# Patient Record
Sex: Female | Born: 1995 | Race: Black or African American | Hispanic: No | Marital: Single | Smoking: Never smoker
Health system: Southern US, Community
[De-identification: ages and names within clinical notes are randomized; demographics above are authoritative.]

## PROBLEM LIST (undated history)

## (undated) ENCOUNTER — Ambulatory Visit: Admission: EM | Payer: Medicaid Other | Source: Home / Self Care

## (undated) ENCOUNTER — Inpatient Hospital Stay (HOSPITAL_COMMUNITY): Payer: Self-pay

## (undated) DIAGNOSIS — M549 Dorsalgia, unspecified: Secondary | ICD-10-CM

## (undated) DIAGNOSIS — Z789 Other specified health status: Secondary | ICD-10-CM

## (undated) DIAGNOSIS — O9989 Other specified diseases and conditions complicating pregnancy, childbirth and the puerperium: Secondary | ICD-10-CM

## (undated) HISTORY — PX: MYRINGOTOMY WITH TUBE PLACEMENT: SHX5663

---
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
## 2015-03-29 ENCOUNTER — Emergency Department (HOSPITAL_COMMUNITY)
Admission: EM | Admit: 2015-03-29 | Discharge: 2015-03-29 | Disposition: A | Discharge status: Home / Self Care | Payer: Medicaid Other | Attending: Emergency Medicine | Admitting: Emergency Medicine

## 2015-03-29 ENCOUNTER — Emergency Department (HOSPITAL_COMMUNITY)
Admission: EM | Admit: 2015-03-29 | Discharge: 2015-03-29 | Disposition: A | Discharge status: Home / Self Care | Payer: Medicaid Other | Source: Home / Self Care | Attending: Emergency Medicine | Admitting: Emergency Medicine

## 2015-03-29 ENCOUNTER — Encounter (HOSPITAL_COMMUNITY): Payer: Self-pay | Admitting: Emergency Medicine

## 2015-03-29 ENCOUNTER — Encounter (HOSPITAL_COMMUNITY): Payer: Self-pay

## 2015-03-29 DIAGNOSIS — R1110 Vomiting, unspecified: Secondary | ICD-10-CM

## 2015-03-29 DIAGNOSIS — R1030 Lower abdominal pain, unspecified: Secondary | ICD-10-CM

## 2015-03-29 DIAGNOSIS — Z3202 Encounter for pregnancy test, result negative: Secondary | ICD-10-CM | POA: Diagnosis not present

## 2015-03-29 DIAGNOSIS — Z720 Tobacco use: Secondary | ICD-10-CM | POA: Insufficient documentation

## 2015-03-29 DIAGNOSIS — R42 Dizziness and giddiness: Secondary | ICD-10-CM | POA: Insufficient documentation

## 2015-03-29 DIAGNOSIS — N898 Other specified noninflammatory disorders of vagina: Secondary | ICD-10-CM | POA: Diagnosis not present

## 2015-03-29 DIAGNOSIS — R197 Diarrhea, unspecified: Secondary | ICD-10-CM | POA: Diagnosis not present

## 2015-03-29 DIAGNOSIS — R109 Unspecified abdominal pain: Secondary | ICD-10-CM

## 2015-03-29 DIAGNOSIS — R112 Nausea with vomiting, unspecified: Secondary | ICD-10-CM | POA: Diagnosis not present

## 2015-03-29 LAB — POC URINE PREG, ED: Preg Test, Ur: NEGATIVE

## 2015-03-29 LAB — WET PREP, GENITAL
CLUE CELLS WET PREP: NONE SEEN
TRICH WET PREP: NONE SEEN
WBC WET PREP: NONE SEEN
Yeast Wet Prep HPF POC: NONE SEEN

## 2015-03-29 LAB — COMPREHENSIVE METABOLIC PANEL
ALK PHOS: 67 U/L (ref 38–126)
ALT: 13 U/L — AB (ref 14–54)
AST: 19 U/L (ref 15–41)
Albumin: 4 g/dL (ref 3.5–5.0)
Anion gap: 10 (ref 5–15)
BUN: 12 mg/dL (ref 6–20)
CHLORIDE: 103 mmol/L (ref 101–111)
CO2: 25 mmol/L (ref 22–32)
Calcium: 9.8 mg/dL (ref 8.9–10.3)
Creatinine, Ser: 0.81 mg/dL (ref 0.44–1.00)
GLUCOSE: 101 mg/dL — AB (ref 65–99)
POTASSIUM: 3.7 mmol/L (ref 3.5–5.1)
Sodium: 138 mmol/L (ref 135–145)
Total Bilirubin: 0.6 mg/dL (ref 0.3–1.2)
Total Protein: 7.6 g/dL (ref 6.5–8.1)

## 2015-03-29 LAB — URINALYSIS, ROUTINE W REFLEX MICROSCOPIC
BILIRUBIN URINE: NEGATIVE
GLUCOSE, UA: NEGATIVE mg/dL
Hgb urine dipstick: NEGATIVE
KETONES UR: 15 mg/dL — AB
LEUKOCYTES UA: NEGATIVE
Nitrite: NEGATIVE
PH: 6.5 (ref 5.0–8.0)
PROTEIN: NEGATIVE mg/dL
Specific Gravity, Urine: 1.03 (ref 1.005–1.030)
Urobilinogen, UA: 1 mg/dL (ref 0.0–1.0)

## 2015-03-29 LAB — CBC WITH DIFFERENTIAL/PLATELET
BASOS ABS: 0.1 10*3/uL (ref 0.0–0.1)
BASOS PCT: 1 % (ref 0–1)
EOS ABS: 0.1 10*3/uL (ref 0.0–0.7)
Eosinophils Relative: 2 % (ref 0–5)
HCT: 35.6 % — ABNORMAL LOW (ref 36.0–46.0)
HEMOGLOBIN: 10.8 g/dL — AB (ref 12.0–15.0)
LYMPHS PCT: 34 % (ref 12–46)
Lymphs Abs: 2.2 10*3/uL (ref 0.7–4.0)
MCH: 22.7 pg — AB (ref 26.0–34.0)
MCHC: 30.3 g/dL (ref 30.0–36.0)
MCV: 74.9 fL — ABNORMAL LOW (ref 78.0–100.0)
MONO ABS: 0.5 10*3/uL (ref 0.1–1.0)
Monocytes Relative: 7 % (ref 3–12)
NEUTROS PCT: 56 % (ref 43–77)
Neutro Abs: 3.7 10*3/uL (ref 1.7–7.7)
PLATELETS: 330 10*3/uL (ref 150–400)
RBC: 4.75 MIL/uL (ref 3.87–5.11)
RDW: 16.3 % — AB (ref 11.5–15.5)
WBC: 6.6 10*3/uL (ref 4.0–10.5)

## 2015-03-29 LAB — HIV ANTIBODY (ROUTINE TESTING W REFLEX): HIV SCREEN 4TH GENERATION: NONREACTIVE

## 2015-03-29 LAB — GC/CHLAMYDIA PROBE AMP (~~LOC~~) NOT AT ARMC
Chlamydia: NEGATIVE
NEISSERIA GONORRHEA: NEGATIVE

## 2015-03-29 LAB — LIPASE, BLOOD: LIPASE: 24 U/L (ref 22–51)

## 2015-03-29 LAB — RPR: RPR: NONREACTIVE

## 2015-03-29 MED ORDER — ONDANSETRON HCL 4 MG PO TABS: 4.0000 mg | ORAL_TABLET | Freq: Three times a day (TID) | ORAL | Status: DC | PRN

## 2015-03-29 NOTE — Discharge Instructions (Signed)
Your tests that came back tonight are normal. Drink plenty of fluids. Use the zofran if you get nauseated or have vomiting again. You can take imodium OTC if you get diarrhea again. You will be called if your other tests are positive for infection.  Diarrhea Diarrhea is watery poop (stool). It can make you feel weak, tired, thirsty, or give you a dry mouth (signs of dehydration). Watery poop is a sign of another problem, most often an infection. It often lasts 2-3 days. It can last longer if it is a sign of something serious. Take care of yourself as told by your doctor. HOME CARE   Drink 1 cup (8 ounces) of fluid each time you have watery poop.  Do not drink the following fluids:  Those that contain simple sugars (fructose, glucose, galactose, lactose, sucrose, maltose).  Sports drinks.  Fruit juices.  Whole milk products.  Sodas.  Drinks with caffeine (coffee, tea, soda) or alcohol.  Oral rehydration solution may be used if the doctor says it is okay. You may make your own solution. Follow this recipe:   - teaspoon table salt.   teaspoon baking soda.   teaspoon salt substitute containing potassium chloride.  1 tablespoons sugar.  1 liter (34 ounces) of water.  Avoid the following foods:  High fiber foods, such as raw fruits and vegetables.  Nuts, seeds, and whole grain breads and cereals.   Those that are sweetened with sugar alcohols (xylitol, sorbitol, mannitol).  Try eating the following foods:  Starchy foods, such as rice, toast, pasta, low-sugar cereal, oatmeal, baked potatoes, crackers, and bagels.  Bananas.  Applesauce.  Eat probiotic-rich foods, such as yogurt and milk products that are fermented.  Wash your hands well after each time you have watery poop.  Only take medicine as told by your doctor.  Take a warm bath to help lessen burning or pain from having watery poop. GET HELP RIGHT AWAY IF:   You cannot drink fluids without throwing up  (vomiting).  You keep throwing up.  You have blood in your poop, or your poop looks black and tarry.  You do not pee (urinate) in 6-8 hours, or there is only a small amount of very dark pee.  You have belly (abdominal) pain that gets worse or stays in the same spot (localizes).  You are weak, dizzy, confused, or light-headed.  You have a very bad headache.  Your watery poop gets worse or does not get better.  You have a fever or lasting symptoms for more than 2-3 days.  You have a fever and your symptoms suddenly get worse. MAKE SURE YOU:   Understand these instructions.  Will watch your condition.  Will get help right away if you are not doing well or get worse. Document Released: 04/15/2008 Document Revised: 03/14/2014 Document Reviewed: 07/05/2012 [facility] Patient Information 2015 [place]. This information is not intended to replace advice given to you by your health care provider. Make sure you discuss any questions you have with your health care provider.  Nausea and Vomiting Nausea means you feel sick to your stomach. Throwing up (vomiting) is a reflex where stomach contents come out of your mouth. HOME CARE   Take medicine as told by your doctor.  Do not force yourself to eat. However, you do need to drink fluids.  If you feel like eating, eat a normal diet as told by your doctor.  Eat rice, wheat, potatoes, bread, lean meats, yogurt, fruits, and vegetables.  Avoid high-fat foods.  Drink enough fluids to keep your pee (urine) clear or pale yellow.  Ask your doctor how to replace body fluid losses (rehydrate). Signs of body fluid loss (dehydration) include:  Feeling very thirsty.  Dry lips and mouth.  Feeling dizzy.  Dark pee.  Peeing less than normal.  Feeling confused.  Fast breathing or heart rate. GET HELP RIGHT AWAY IF:   You have blood in your throw up.  You have black or bloody poop (stool).  You have a bad headache or stiff  neck.  You feel confused.  You have bad belly (abdominal) pain.  You have chest pain or trouble breathing.  You do not pee at least once every 8 hours.  You have cold, clammy skin.  You keep throwing up after 24 to 48 hours.  You have a fever. MAKE SURE YOU:   Understand these instructions.  Will watch your condition.  Will get help right away if you are not doing well or get worse. Document Released: 04/15/2008 Document Revised: 01/20/2012 Document Reviewed: 03/29/2011 [facility] Patient Information 2015 [place], [place]. This information is not intended to replace advice given to you by your health care provider. Make sure you discuss any questions you have with your health care provider.

## 2015-03-29 NOTE — ED Notes (Signed)
Pt reports anorexia beginning in April, reports feeling hungry but eating only 1 meal/ day.  Pt denies desire to lose weight.

## 2015-03-29 NOTE — ED Notes (Signed)
Pt c/o dizziness and lower abdominal pain x 4 days.  Pain score 4/10.  Pt reports "sitting down" makes pain decrease.  [facility]this morning for same.  Pt has not taken any prescribed medications and denies new complaints.

## 2015-03-29 NOTE — Discharge Instructions (Signed)

## 2015-03-29 NOTE — ED Notes (Addendum)
Pt's grandmother  Caleen Jobslberta Briggs   [phone] requests update, pt authorizes any info to be shared.

## 2015-03-29 NOTE — ED Provider Notes (Signed)
CSN: [id number]     Arrival date & time 03/29/15  1726 History   First MD Initiated Contact with Patient 03/29/15 1815     Chief Complaint  Patient presents with  . Abdominal Pain  . Dizziness     (Consider location/radiation/quality/duration/timing/severity/associated sxs/prior Treatment) Patient is a 19 y.o. female presenting with abdominal pain and dizziness. The history is provided by a parent and the patient.  Abdominal Pain Pain location:  Suprapubic Pain quality: aching   Pain radiates to:  Does not radiate Pain severity:  Mild Onset quality:  Gradual Duration:  2 days Timing:  Intermittent Progression:  Partially resolved Chronicity:  New Context comment:  Nausea and vomiting Relieved by:  Nothing Worsened by:  Nothing tried Ineffective treatments:  None tried Associated symptoms: no chest pain, no cough, no diarrhea, no dysuria, no fatigue, no fever, no hematuria, no nausea, no shortness of breath and no vomiting   Dizziness Associated symptoms: no chest pain, no diarrhea, no headaches, no nausea, no shortness of breath and no vomiting     History reviewed. No pertinent past medical history. Past Surgical History  Procedure Laterality Date  . Myringotomy with tube placement     History reviewed. No pertinent family history. History  Substance Use Topics  . Smoking status: Current Some Day Smoker  . Smokeless tobacco: Not on file  . Alcohol Use: Yes   OB History    No data available     Review of Systems  Constitutional: Negative for fever and fatigue.  HENT: Negative for congestion and drooling.   Eyes: Negative for pain.  Respiratory: Negative for cough and shortness of breath.   Cardiovascular: Negative for chest pain.  Gastrointestinal: Positive for abdominal pain. Negative for nausea, vomiting and diarrhea.  Genitourinary: Negative for dysuria and hematuria.  Musculoskeletal: Negative for back pain, gait problem and neck pain.  Skin: Negative for  color change.  Neurological: Negative for dizziness and headaches.       Lightheadedness  Hematological: Negative for adenopathy.  Psychiatric/Behavioral: Negative for behavioral problems.  All other systems reviewed and are negative.     Allergies  Review of patient's allergies indicates no known allergies.  Home Medications   Prior to Admission medications   Medication Sig Start Date End Date Taking? Authorizing Provider  ibuprofen (ADVIL,MOTRIN) 200 MG tablet Take 400 mg by mouth every 6 (six) hours as needed (pain).    Historical Provider, MD  ondansetron (ZOFRAN) 4 MG tablet Take 1 tablet (4 mg total) by mouth every 8 (eight) hours as needed for nausea or vomiting. 03/29/15   Devoria AlbeIva Knapp, MD   BP 117/69 mmHg  Pulse 102  Temp(Src) 98.1 F (36.7 C) (Oral)  Resp 16  SpO2 100%  LMP 03/15/2015 Physical Exam  Constitutional: She is oriented to person, place, and time. She appears well-developed and well-nourished.  HENT:  Head: Normocephalic and atraumatic.  Mouth/Throat: Oropharynx is clear and moist. No oropharyngeal exudate.  Eyes: Conjunctivae and EOM are normal. Pupils are equal, round, and reactive to light.  Neck: Normal range of motion. Neck supple.  Cardiovascular: Normal rate, regular rhythm, normal heart sounds and intact distal pulses.  Exam reveals no gallop and no friction rub.   No murmur heard. Pulmonary/Chest: Effort normal and breath sounds normal. No respiratory distress. She has no wheezes.  Abdominal: Soft. Bowel sounds are normal. There is no tenderness. There is no rebound and no guarding.  Musculoskeletal: Normal range of motion. She exhibits no edema or  tenderness.  Neurological: She is alert and oriented to person, place, and time.  Skin: Skin is warm and dry.  Psychiatric: She has a normal mood and affect. Her behavior is normal.  Nursing note and vitals reviewed.   ED Course  Procedures (including critical care time) Labs Review Labs Reviewed -  No data to display  Imaging Review No results found.   EKG Interpretation None      MDM   Final diagnoses:  Lower abdominal pain  Lightheadedness    7:19 PM 19 y.o. female who presents with lower abdominal pain and lightheadedness. She was seen yesterday for some more symptoms and was also having nausea, vomiting, and diarrhea at that time. She notes only the lower abdominal pain has persisted. She states it is intermittent and she is currently symptomatic on exam. She also denies lightheadedness currently. She is mildly tachycardic but vital signs are otherwise unremarkable. I reviewed her workup from yesterday which showed a noncontributory laboratory workup and pelvic exam. She has a normal exam today without abdominal tenderness. She is likely mildly dehydrated. I offered IV fluids but she declined. I do not think any other workup is necessary as this is likely viral. Will recommend oral hydration at home. The patient and family are happy with this. We'll have them return for any fevers or worsening symptoms.    Purvis SheffieldForrest Hadyn Blanck, MD 03/29/15 [phone]

## 2015-03-29 NOTE — ED Notes (Signed)
Pt. reports low abdominal pain with emesis  and diarrhea onset today. Denies fever or chills.

## 2015-03-29 NOTE — ED Notes (Signed)
Pt verbalized understanding of d/c instructions and has no further questions.  

## 2015-03-29 NOTE — ED Provider Notes (Signed)
CSN: [id number]     Arrival date & time 03/29/15  0015 History  This chart was scribed for Devoria Albe, MD by Buckner Malta, ED Scribe. This patient was seen in room A02C/A02C and the patient's care was started at 2:15 AM.    Chief Complaint  Patient presents with  . Abdominal Pain  The history is provided by the patient. No language interpreter was used.    HPI Comments: Heather Whitaker is a 19 y.o. female who presents to the Emergency Department complaining of intermitant, moderate, lower-abdominal pain beginning this morning after she was at work for 3 hours with each episode lasting 5- minutes. She can only describe the pain is "hurts". She states it's not cramping. The last episode was just PTA. She lists lightheadness, nausea, vomiting (1 episode), watery diarrhea (1 episode), and vaginal discharge beginning today as associated symptoms. Her LNMP was 03/14/2015, and her last sexual intercourse experience was 1 month ago. The patient admits that she does not use protection during sexual intercourse. The patient states that she has never been pregnant. She denies urinary symptoms. The patient admits to smoking black and milds approximately twice a month.    PCP Dr Duffy Rhody  History reviewed. No pertinent past medical history. Past Surgical History  Procedure Laterality Date  . Myringotomy with tube placement     No family history on file. History  Substance Use Topics  . Smoking status: Current Some Day Smoker  . Smokeless tobacco: Not on file  . Alcohol Use: Yes  smokes black and milds twice monthly Lives with [employer] employed   OB History    No data available     Review of Systems  Gastrointestinal: Positive for nausea, vomiting, abdominal pain and diarrhea.  Genitourinary: Positive for vaginal discharge. Negative for dysuria and frequency.  Neurological: Positive for light-headedness.  All other systems reviewed and are negative.     Allergies  Review of patient's allergies  indicates no known allergies.  Home Medications   Prior to Admission medications   Medication Sig Start Date End Date Taking? Authorizing Provider  ibuprofen (ADVIL,MOTRIN) 200 MG tablet Take 400 mg by mouth every 6 (six) hours as needed (pain).   Yes Historical Provider, MD  ondansetron (ZOFRAN) 4 MG tablet Take 1 tablet (4 mg total) by mouth every 8 (eight) hours as needed for nausea or vomiting. 03/29/15   Devoria Albe, MD   BP 124/84 mmHg  Pulse 78  Temp(Src) 98.4 F (36.9 C) (Oral)  Resp 14  Ht  (1.651 m)  Wt 121 lb (54.885 kg)  BMI 20.14 kg/m2  SpO2 99%  LMP 03/15/2015   Vital signs normal    Physical Exam  Constitutional: She is oriented to person, place, and time. She appears well-developed and well-nourished.  Non-toxic appearance. She does not appear ill. No distress.  HENT:  Head: Normocephalic and atraumatic.  Right Ear: External ear normal.  Left Ear: External ear normal.  Nose: Nose normal. No mucosal edema or rhinorrhea.  Mouth/Throat: Oropharynx is clear and moist and mucous membranes are normal. No dental abscesses or uvula swelling.  Eyes: Conjunctivae and EOM are normal. Pupils are equal, round, and reactive to light.  Neck: Normal range of motion and full passive range of motion without pain. Neck supple.  Cardiovascular: Normal rate, regular rhythm and normal heart sounds.  Exam reveals no gallop and no friction rub.   No murmur heard. Pulmonary/Chest: Effort normal and breath sounds normal. No respiratory distress. She has  no wheezes. She has no rhonchi. She has no rales. She exhibits no tenderness and no crepitus.  Abdominal: Soft. Normal appearance and bowel sounds are normal. She exhibits no distension. There is tenderness. There is no rebound and no guarding.    Tenderness in lower abdomen   Genitourinary:  She has normal external genitalia. There is a small amount of white discharge present. Her uterus is normal size and nontender to palpation.  Her left ovary is normal size and nontender to palpation. Her right ovary is not enlarged but she has some mild discomfort to palpation.  Musculoskeletal: Normal range of motion. She exhibits no edema or tenderness.  Moves all extremities well.   Neurological: She is alert and oriented to person, place, and time. She has normal strength. No cranial nerve deficit.  Skin: Skin is warm, dry and intact. No rash noted. No erythema. No pallor.  Psychiatric: She has a normal mood and affect. Her speech is normal and behavior is normal. Her mood appears not anxious.  Nursing note and vitals reviewed.   ED Course  Procedures  DIAGNOSTIC STUDIES: Oxygen Saturation is 99% on RA, Normal by my interpretation.    COORDINATION OF CARE: 2:45 AM Discussed treatment plan at bedside including pelvic exam and STD screen. Pt agreed to plan.    Recheck at 4:30 AM patient still denies nausea, vomiting, or diarrhea. She is not having abdominal pain at this time.  Patient had no further symptoms during her ED visit. She was given prescription for Zofran in case she gets nauseated again. We discussed using protection until she decides to get pregnant to prevent sexually transmitted diseases.  Labs Review Results for orders placed or performed during the hospital encounter of 03/29/15  Wet prep, genital  Result Value Ref Range   Yeast Wet Prep HPF POC NONE SEEN NONE SEEN   Trich, Wet Prep NONE SEEN NONE SEEN   Clue Cells Wet Prep HPF POC NONE SEEN NONE SEEN   WBC, Wet Prep HPF POC NONE SEEN NONE SEEN  CBC with Differential  Result Value Ref Range   WBC 6.6 4.0 - 10.5 K/uL   RBC 4.75 3.87 - 5.11 MIL/uL   Hemoglobin 10.8 (L) 12.0 - 15.0 g/dL   HCT 16.135.6 (L) 09.636.0 - 04.546.0 %   MCV 74.9 (L) 78.0 - 100.0 fL   MCH 22.7 (L) 26.0 - 34.0 pg   MCHC 30.3 30.0 - 36.0 g/dL   RDW 40.916.3 (H) 81.111.5 - 91.415.5 %   Platelets 330 150 - 400 K/uL   Neutrophils Relative % 56 43 - 77 %   Lymphocytes Relative 34 12 - 46 %   Monocytes  Relative 7 3 - 12 %   Eosinophils Relative 2 0 - 5 %   Basophils Relative 1 0 - 1 %   Neutro Abs 3.7 1.7 - 7.7 K/uL   Lymphs Abs 2.2 0.7 - 4.0 K/uL   Monocytes Absolute 0.5 0.1 - 1.0 K/uL   Eosinophils Absolute 0.1 0.0 - 0.7 K/uL   Basophils Absolute 0.1 0.0 - 0.1 K/uL   RBC Morphology ELLIPTOCYTES   Comprehensive metabolic panel  Result Value Ref Range   Sodium 138 135 - 145 mmol/L   Potassium 3.7 3.5 - 5.1 mmol/L   Chloride 103 101 - 111 mmol/L   CO2 25 22 - 32 mmol/L   Glucose, Bld 101 (H) 65 - 99 mg/dL   BUN 12 6 - 20 mg/dL   Creatinine, Ser 7.820.81 0.44 -  1.00 mg/dL   Calcium 9.8 8.9 - 30.810.3 mg/dL   Total Protein 7.6 6.5 - 8.1 g/dL   Albumin 4.0 3.5 - 5.0 g/dL   AST 19 15 - 41 U/L   ALT 13 (L) 14 - 54 U/L   Alkaline Phosphatase 67 38 - 126 U/L   Total Bilirubin 0.6 0.3 - 1.2 mg/dL   GFR calc non Af Amer >60 >60 mL/min   GFR calc Af Amer >60 >60 mL/min   Anion gap 10 5 - 15  Lipase, blood  Result Value Ref Range   Lipase 24 22 - 51 U/L  Urinalysis, Routine w reflex microscopic  Result Value Ref Range   Color, Urine YELLOW YELLOW   APPearance CLEAR CLEAR   Specific Gravity, Urine 1.030 1.005 - 1.030   pH 6.5 5.0 - 8.0   Glucose, UA NEGATIVE NEGATIVE mg/dL   Hgb urine dipstick NEGATIVE NEGATIVE   Bilirubin Urine NEGATIVE NEGATIVE   Ketones, ur 15 (A) NEGATIVE mg/dL   Protein, ur NEGATIVE NEGATIVE mg/dL   Urobilinogen, UA 1.0 0.0 - 1.0 mg/dL   Nitrite NEGATIVE NEGATIVE   Leukocytes, UA NEGATIVE NEGATIVE  POC Urine Pregnancy, ED  (If Pre-menopausal female)  [facility]  Result Value Ref Range   Preg Test, Ur NEGATIVE NEGATIVE   Laboratory interpretation all normal     Imaging Review No results found.   EKG Interpretation None      MDM   Final diagnoses:  Vomiting and diarrhea  Abdominal pain, unspecified abdominal location    Discharge Medication List as of 03/29/2015  5:54 AM    START taking these medications   Details  ondansetron (ZOFRAN) 4 MG  tablet Take 1 tablet (4 mg total) by mouth every 8 (eight) hours as needed for nausea or vomiting., Starting 03/29/2015, Until Discontinued, Print        Plan discharge  Devoria AlbeIva Annakate Soulier, MD, FACEP   I personally performed the services described in this documentation, which was scribed in my presence. The recorded information has been reviewed and considered.  Devoria AlbeIva Noheli Melder, MD, Concha PyoFACEP     Yarenis Cerino, MD 03/29/15 [phone]

## 2015-05-02 ENCOUNTER — Emergency Department (HOSPITAL_COMMUNITY)
Admission: EM | Admit: 2015-05-02 | Discharge: 2015-05-02 | Disposition: A | Discharge status: Home / Self Care | Payer: Medicaid Other | Attending: Emergency Medicine | Admitting: Emergency Medicine

## 2015-05-02 ENCOUNTER — Encounter (HOSPITAL_COMMUNITY): Payer: Self-pay | Admitting: *Deleted

## 2015-05-02 DIAGNOSIS — Z3202 Encounter for pregnancy test, result negative: Secondary | ICD-10-CM | POA: Diagnosis not present

## 2015-05-02 DIAGNOSIS — D649 Anemia, unspecified: Secondary | ICD-10-CM

## 2015-05-02 DIAGNOSIS — R110 Nausea: Secondary | ICD-10-CM | POA: Insufficient documentation

## 2015-05-02 DIAGNOSIS — R55 Syncope and collapse: Secondary | ICD-10-CM

## 2015-05-02 DIAGNOSIS — Z720 Tobacco use: Secondary | ICD-10-CM | POA: Insufficient documentation

## 2015-05-02 LAB — CBC
HCT: 36.3 % (ref 36.0–46.0)
Hemoglobin: 10.7 g/dL — ABNORMAL LOW (ref 12.0–15.0)
MCH: 21.9 pg — ABNORMAL LOW (ref 26.0–34.0)
MCHC: 29.5 g/dL — ABNORMAL LOW (ref 30.0–36.0)
MCV: 74.4 fL — ABNORMAL LOW (ref 78.0–100.0)
PLATELETS: 245 10*3/uL (ref 150–400)
RBC: 4.88 MIL/uL (ref 3.87–5.11)
RDW: 16.3 % — AB (ref 11.5–15.5)
WBC: 7.6 10*3/uL (ref 4.0–10.5)

## 2015-05-02 LAB — POC URINE PREG, ED: Preg Test, Ur: NEGATIVE

## 2015-05-02 NOTE — Discharge Instructions (Signed)
Please read and follow all provided instructions.  Your diagnoses today include:  1. Near syncope    Tests performed today include:  EKG - normal  Blood counts - shows mild anemia  Vital signs. See below for your results today.   Medications prescribed:   None  Take any prescribed medications only as directed.  Home care instructions:  Follow any educational materials contained in this packet.  Hydrate well and eat well prior to working in the heat and on a daily basis. Increase iron-rich foods or start a multivitamin.   Follow-up instructions: Please follow-up with your primary care provider in the next 3 days for further evaluation of your symptoms.   Return instructions:   Please return to the Emergency Department if you experience worsening symptoms.   Return with worsening lightheadedness or if you pass out.   Please return if you have any other emergent concerns.  Additional Information:  Your vital signs today were: BP 104/68 mmHg   Pulse 78   Temp(Src) 98.5 F (36.9 C) (Oral)   Resp 18   SpO2 100%   LMP 04/12/2015 If your blood pressure (BP) was elevated above 135/85 this visit, please have this repeated by your doctor within one month. --------------

## 2015-05-02 NOTE — ED Notes (Signed)
Per EMS, pt had near syncopal episode this afternoon. EMS was called by pt's coworker at [employer] when the pt was found laying on the bathroom floor, crying. Pt denies passing out/fall. Pt states she became overheated and had to lay down. Pt states she is not given enough breaks at work and has had problems with management in the past over this issue. Pt A&Ox4 upon arrival to ED.

## 2015-05-02 NOTE — ED Provider Notes (Signed)
CSN: [id number]     Arrival date & time 05/02/15  1502 History   First MD Initiated Contact with Patient 05/02/15 1539     Chief Complaint  Patient presents with  . Near Syncope     (Consider location/radiation/quality/duration/timing/severity/associated sxs/prior Treatment) HPI Comments: Patient presents with complaint of near-syncope and lightheadedness. Patient states that she did not eat or drink well this morning. She went to work and was working in a [employer] window in the heat. She states that she became lightheaded and asked for break and was denied. She then worked for an additional half an hour and felt more lightheaded and nauseous. She states she bent over without relief and eventually went into a bathroom and laid down on the floor. She did not have full syncope. Coworkers were concerned about her and called EMS for transport to the hospital. Patient denies headache, URI symptoms, vomiting, diarrhea. No chest pain, shortness of breath or abdominal pain. No urinary symptoms or vaginal symptoms. Patient was seen in emergency department approximately one month ago for similar symptoms. At that time she had a workup which included a CBC which showed a mild anemia, likely iron deficiency. She was not started on any treatments for this at that time. Onset of symptoms acute. Course is resolved. Nothing makes symptoms better or worse. Patient denies family history of early unexplained cardiac death or history of premature heart disease. She denies any lightheadedness or syncope with exertion or exercise in the past.  Patient is a 19 y.o. female presenting with near-syncope. The history is provided by the patient and medical records.  Near Syncope Associated symptoms include nausea. Pertinent negatives include no abdominal pain, chest pain, coughing, fever, headaches, myalgias, rash, sore throat or vomiting.    History reviewed. No pertinent past medical history. Past Surgical History   Procedure Laterality Date  . Myringotomy with tube placement     No family history on file. History  Substance Use Topics  . Smoking status: Current Some Day Smoker  . Smokeless tobacco: Not on file  . Alcohol Use: Yes   OB History    No data available     Review of Systems  Constitutional: Negative for fever.  HENT: Negative for rhinorrhea and sore throat.   Eyes: Negative for redness.  Respiratory: Negative for cough.   Cardiovascular: Positive for near-syncope. Negative for chest pain.  Gastrointestinal: Positive for nausea. Negative for vomiting, abdominal pain and diarrhea.  Genitourinary: Negative for dysuria.  Musculoskeletal: Negative for myalgias.  Skin: Negative for rash.  Neurological: Positive for light-headedness. Negative for syncope (+ near syncope) and headaches.    Allergies  Review of patient's allergies indicates no known allergies.  Home Medications   Prior to Admission medications   Medication Sig Start Date End Date Taking? Authorizing Provider  aspirin-acetaminophen-caffeine (EXCEDRIN EXTRA STRENGTH) [phone] MG per tablet Take 1 tablet by mouth every 6 (six) hours as needed for headache.   Yes Historical Provider, MD  ondansetron (ZOFRAN) 4 MG tablet Take 1 tablet (4 mg total) by mouth every 8 (eight) hours as needed for nausea or vomiting. Patient not taking: Reported on 05/02/2015 03/29/15   Devoria Albe, MD   BP 104/68 mmHg  Pulse 78  Temp(Src) 98.5 F (36.9 C) (Oral)  Resp 18  SpO2 100%  LMP 04/12/2015   Physical Exam  Constitutional: She appears well-developed and well-nourished.  HENT:  Head: Normocephalic and atraumatic.  Eyes: Conjunctivae are normal. Right eye exhibits no discharge. Left eye exhibits  no discharge.  Neck: Normal range of motion. Neck supple.  Cardiovascular: Normal rate, regular rhythm and normal heart sounds.   No murmur heard. Pulmonary/Chest: Effort normal and breath sounds normal. No respiratory distress. She  has no wheezes. She has no rales.  Abdominal: Soft. There is no tenderness.  Musculoskeletal: She exhibits no edema or tenderness.  Neurological: She is alert.  Skin: Skin is warm and dry.  Psychiatric: She has a normal mood and affect.  Nursing note and vitals reviewed.   ED Course  Procedures (including critical care time) Labs Review Labs Reviewed  CBC - Abnormal; Notable for the following:    Hemoglobin 10.7 (*)    MCV 74.4 (*)    MCH 21.9 (*)    MCHC 29.5 (*)    RDW 16.3 (*)    All other components within normal limits  POC URINE PREG, ED    Imaging Review No results found.   EKG Interpretation   Date/Time:  Tuesday May 02 2015 15:19:46 EDT Ventricular Rate:  81 PR Interval:  147 QRS Duration: 73 QT Interval:  361 QTC Calculation: 419 R Axis:   79 Text Interpretation:  Sinus rhythm No previous ECGs available Confirmed by  YAO  MD, DAVID ([id number]) on 05/02/2015 3:58:28 PM       4:20 PM Patient seen and examined. Work-up initiated. Will recheck CBC given anemia at last visit. Patient encouraged to start on a multivitamin containing iron and increase iron rich foods in her diet. She is encouraged to hydrate well before work especially if working in the heat.  Vital signs reviewed and are as follows: BP 104/68 mmHg  Pulse 78  Temp(Src) 98.5 F (36.9 C) (Oral)  Resp 18  SpO2 100%  LMP 04/12/2015  4:33 PM Again, mild anemia noted. Family and patient informed.   Discussed conservative measures to help with iron deficiency, near syncope. Encouraged PCP follow-up if symptoms continue. Return with full syncope, chest pain, shortness of breath or other concerns. Patient and family agree with plan and seem reliable.   MDM   Final diagnoses:  Near syncope  Anemia, unspecified anemia type   Patient with near-syncope, positive prodrome, working in the heat with poor oral intake this morning. No features that would be concerning for a cardiac etiology. No concerning  family history. EKG is normal without signs of Brugada, WPW, prolonged QTC or short PR. Patient is mildly anemic but this is stable.    Renne Crigler, PA-C 05/02/15 1646  Richardean Canal, MD 05/02/15 2030

## 2015-05-02 NOTE — ED Notes (Signed)
Pt alert and oriented x 4, denies nausea, vomiting, dizziness, sweating.  She states that she "feels fine" and just wants something to eat.  No acute distress noted.

## 2015-08-30 ENCOUNTER — Emergency Department (HOSPITAL_COMMUNITY): Payer: Medicaid Other

## 2015-08-30 ENCOUNTER — Emergency Department (HOSPITAL_COMMUNITY)
Admission: EM | Admit: 2015-08-30 | Discharge: 2015-08-30 | Disposition: A | Discharge status: Home / Self Care | Payer: Self-pay | Attending: Emergency Medicine | Admitting: Emergency Medicine

## 2015-08-30 ENCOUNTER — Encounter (HOSPITAL_COMMUNITY): Payer: Self-pay | Admitting: Neurology

## 2015-08-30 DIAGNOSIS — Z3490 Encounter for supervision of normal pregnancy, unspecified, unspecified trimester: Secondary | ICD-10-CM

## 2015-08-30 DIAGNOSIS — N390 Urinary tract infection, site not specified: Secondary | ICD-10-CM | POA: Insufficient documentation

## 2015-08-30 DIAGNOSIS — R1032 Left lower quadrant pain: Secondary | ICD-10-CM

## 2015-08-30 DIAGNOSIS — Z7982 Long term (current) use of aspirin: Secondary | ICD-10-CM | POA: Insufficient documentation

## 2015-08-30 DIAGNOSIS — Z3201 Encounter for pregnancy test, result positive: Secondary | ICD-10-CM | POA: Insufficient documentation

## 2015-08-30 LAB — CBC
HEMATOCRIT: 32.7 % — AB (ref 36.0–46.0)
Hemoglobin: 9.9 g/dL — ABNORMAL LOW (ref 12.0–15.0)
MCH: 22.8 pg — AB (ref 26.0–34.0)
MCHC: 30.3 g/dL (ref 30.0–36.0)
MCV: 75.3 fL — AB (ref 78.0–100.0)
PLATELETS: 253 10*3/uL (ref 150–400)
RBC: 4.34 MIL/uL (ref 3.87–5.11)
RDW: 16.7 % — AB (ref 11.5–15.5)
WBC: 4.8 10*3/uL (ref 4.0–10.5)

## 2015-08-30 LAB — URINALYSIS, ROUTINE W REFLEX MICROSCOPIC
Bilirubin Urine: NEGATIVE
GLUCOSE, UA: NEGATIVE mg/dL
Hgb urine dipstick: NEGATIVE
KETONES UR: NEGATIVE mg/dL
Nitrite: NEGATIVE
PH: 6 (ref 5.0–8.0)
Protein, ur: NEGATIVE mg/dL
SPECIFIC GRAVITY, URINE: 1.026 (ref 1.005–1.030)
Urobilinogen, UA: 1 mg/dL (ref 0.0–1.0)

## 2015-08-30 LAB — LIPASE, BLOOD: LIPASE: 27 U/L (ref 22–51)

## 2015-08-30 LAB — COMPREHENSIVE METABOLIC PANEL
ALBUMIN: 3.9 g/dL (ref 3.5–5.0)
ALT: 14 U/L (ref 14–54)
AST: 23 U/L (ref 15–41)
Alkaline Phosphatase: 55 U/L (ref 38–126)
Anion gap: 7 (ref 5–15)
BUN: 10 mg/dL (ref 6–20)
CHLORIDE: 104 mmol/L (ref 101–111)
CO2: 24 mmol/L (ref 22–32)
CREATININE: 0.79 mg/dL (ref 0.44–1.00)
Calcium: 8.9 mg/dL (ref 8.9–10.3)
GFR calc Af Amer: >60 mL/min (ref >60)
GFR calc non Af Amer: >60 mL/min (ref >60)
Glucose, Bld: 100 mg/dL — ABNORMAL HIGH (ref 65–99)
POTASSIUM: 3.2 mmol/L — AB (ref 3.5–5.1)
SODIUM: 135 mmol/L (ref 135–145)
Total Bilirubin: 0.5 mg/dL (ref 0.3–1.2)
Total Protein: 7.2 g/dL (ref 6.5–8.1)

## 2015-08-30 LAB — URINE MICROSCOPIC-ADD ON

## 2015-08-30 LAB — HCG, QUANTITATIVE, PREGNANCY: HCG, BETA CHAIN, QUANT, S: 1236 m[IU]/mL — AB (ref <5)

## 2015-08-30 LAB — PREGNANCY, URINE: PREG TEST UR: POSITIVE — AB

## 2015-08-30 MED ORDER — CEPHALEXIN 500 MG PO CAPS: 500.0000 mg | ORAL_CAPSULE | Freq: Two times a day (BID) | ORAL | Status: DC

## 2015-08-30 MED ORDER — PRENATAL COMPLETE 14-0.4 MG PO TABS
1.0000 | ORAL_TABLET | Freq: Every day | ORAL | Status: DC
Start: 2015-08-30 — End: 2016-01-26

## 2015-08-30 MED ORDER — CEPHALEXIN 250 MG PO CAPS
500.0000 mg | ORAL_CAPSULE | Freq: Once | ORAL | Status: AC
  Administered 2015-08-30: 500 mg via ORAL
  Filled 2015-08-30: qty 2

## 2015-08-30 NOTE — ED Notes (Signed)
Pt reports LLQ abd pain for 1 week, denies n/v/d. Wants to see if she is pregnant, LMP 07/27/15.

## 2015-08-30 NOTE — ED Notes (Signed)
Patient transported to Ultrasound 

## 2015-08-30 NOTE — Discharge Instructions (Signed)
It is very important that you follow-up for repeat blood work in 48 hours. Do not hesitate to return to the emergency [facility] or any close emergency room if you have worsening abdominal pain, heavy bleeding, feel like you're going to pass out, feel short of breath or like her heart is racing quickly.  Please go to [facility] (Address: [address], [place], [place] [address] Phone: [phone])   Please follow with your primary care doctor in the next 2 days for a check-up. They must obtain records for further management.   Do not hesitate to return to the Emergency Department for any new, worsening or concerning symptoms.

## 2015-08-30 NOTE — ED Notes (Signed)
Pt remains in [facility]

## 2015-08-30 NOTE — ED Provider Notes (Signed)
CSN: [id number]     Arrival date & time 08/30/15  1325 History   First MD Initiated Contact with Patient 08/30/15 1338     Chief Complaint  Patient presents with  . Abdominal Pain     (Consider location/radiation/quality/duration/timing/severity/associated sxs/prior Treatment) HPI   Blood pressure 109/67, pulse 106, temperature 99 F (37.2 C), temperature source Oral, resp. rate 14, last menstrual period 07/27/2015, SpO2 100 %.  Delberta Folts is a 19 y.o. female complaining of left lower quadrant pain over the course of the last week. States that it feels sharp, 6 out of 10, no exacerbating or alleviating factors identified. No pain medication taken prior to arrival. Patient is specifically requesting pregnancy test. States that her last menstrual period was last month, she cannot remember when. She has not tried any over-the-counter pregnancy test. Patient denies fever, chills, nausea, vomiting, dysuria, hematuria, urinary frequency, change in bowel movements.  History reviewed. No pertinent past medical history. Past Surgical History  Procedure Laterality Date  . Myringotomy with tube placement     No family history on file. Social History  Substance Use Topics  . Smoking status: Never Smoker   . Smokeless tobacco: None  . Alcohol Use: Yes   OB History    No data available     Review of Systems  10 systems reviewed and found to be negative, except as noted in the HPI.   Allergies  Review of patient's allergies indicates no known allergies.  Home Medications   Prior to Admission medications   Medication Sig Start Date End Date Taking? Authorizing Provider  aspirin-acetaminophen-caffeine (EXCEDRIN EXTRA STRENGTH) [id number] MG per tablet Take 1 tablet by mouth every 6 (six) hours as needed for headache.   Yes Historical Provider, MD   BP 109/67 mmHg  Pulse 106  Temp(Src) 99 F (37.2 C) (Oral)  Resp 14  SpO2 100%  LMP 07/27/2015 Physical Exam  Constitutional: She  is oriented to person, place, and time. She appears well-developed and well-nourished. No distress.  HENT:  Head: Normocephalic and atraumatic.  Mouth/Throat: Oropharynx is clear and moist.  Eyes: Conjunctivae and EOM are normal. Pupils are equal, round, and reactive to light.  Neck: Normal range of motion.  Cardiovascular: Normal rate, regular rhythm and intact distal pulses.   Pulmonary/Chest: Effort normal and breath sounds normal. No respiratory distress. She has no wheezes. She has no rales.  Abdominal: Soft. Bowel sounds are normal. She exhibits no distension and no mass. There is no tenderness. There is no rebound and no guarding.  Musculoskeletal: Normal range of motion.  Neurological: She is alert and oriented to person, place, and time.  Skin: She is not diaphoretic.  Psychiatric: She has a normal mood and affect.  Nursing note and vitals reviewed.   ED Course  Procedures (including critical care time) Labs Review Labs Reviewed  CBC - Abnormal; Notable for the following:    Hemoglobin 9.9 (*)    HCT 32.7 (*)    MCV 75.3 (*)    MCH 22.8 (*)    RDW 16.7 (*)    All other components within normal limits  LIPASE, BLOOD  COMPREHENSIVE METABOLIC PANEL  URINALYSIS, ROUTINE W REFLEX MICROSCOPIC (NOT AT [facility])  HCG, QUANTITATIVE, PREGNANCY    Imaging Review US Ob Comp Less 14 Wks  08/30/2015  CLINICAL DATA:  Left lower quadrant pain. EXAM: OBSTETRIC <14 WK [place] AND TRANSVAGINAL OB US TECHNIQUE: Both transabdominal and transvaginal ultrasound examinations were performed for complete evaluation of the gestation  as well as the maternal uterus, adnexal regions, and pelvic cul-de-sac. Transvaginal technique was performed to assess early pregnancy. COMPARISON:  None. FINDINGS: Intrauterine gestational sac: Probable small intrauterine gestational sac. Yolk sac:  Absent. Embryo:  Absent. Cardiac Activity: Absent. MSD: 3.3 mm  mm   5 w   0  d               [place] EDC: 05/01/2016 Maternal  uterus/adnexae: No subchorionic hemorrhage. Ovaries are unremarkable. Small free fluid. IMPRESSION: Probable small intrauterine gestational sac, favoring an early IUP, 5 weeks 0 days. Difficult to definitively exclude ectopic pregnancy. Small free fluid. Electronically Signed   By: Leanna Battles M.D.   On: 08/30/2015 18:37   US Ob Transvaginal  08/30/2015  CLINICAL DATA:  Left lower quadrant pain. EXAM: OBSTETRIC <14 WK [place] AND TRANSVAGINAL OB US TECHNIQUE: Both transabdominal and transvaginal ultrasound examinations were performed for complete evaluation of the gestation as well as the maternal uterus, adnexal regions, and pelvic cul-de-sac. Transvaginal technique was performed to assess early pregnancy. COMPARISON:  None. FINDINGS: Intrauterine gestational sac: Probable small intrauterine gestational sac. Yolk sac:  Absent. Embryo:  Absent. Cardiac Activity: Absent. MSD: 3.3 mm  mm   5 w   0  d               [place] EDC: 05/01/2016 Maternal uterus/adnexae: No subchorionic hemorrhage. Ovaries are unremarkable. Small free fluid. IMPRESSION: Probable small intrauterine gestational sac, favoring an early IUP, 5 weeks 0 days. Difficult to definitively exclude ectopic pregnancy. Small free fluid. Electronically Signed   By: Leanna Battles M.D.   On: 08/30/2015 18:37   I have personally reviewed and evaluated these images and lab results as part of my medical decision-making.   EKG Interpretation None      MDM   Final diagnoses:  Pregnancy  UTI (lower urinary tract infection)    Filed Vitals:   08/30/15 1630 08/30/15 1700 08/30/15 1730 08/30/15 1850  BP: 111/65 116/66 103/57 115/70  Pulse: 78 89 81 88  Temp:      TempSrc:      Resp:    12  SpO2: 100% 100% 100% 100%    Medications  cephALEXin (KEFLEX) capsule 500 mg (500 mg Oral Given 08/30/15 1622)    Heather Whitaker is 19 y.o. female presenting with left lower quadrant abdominal pain no other symptoms. Patient is concerned that she is  pregnant. Prima para. Urine pregnancy positive. Abdominal exam is benign however, she is reporting left lower quadrant pain. Abd exam benign, doubt ectopic. Her quantitative is about 1200. Ultrasound does show likely IUP, cannot rule out ectopics. We've had an extensive discussion of returns for ectopic. I've advised her she will need to follow for repeat quantitative hCG at [facility] hospital in the next 48 hours. Patient wishes to terminate the pregnancy. I will give [facility] referral to [facility], we'll treat her urinary tract infection and also start her on prenatal vitamins in case she changes her mind.  Evaluation does not show pathology that would require ongoing emergent intervention or inpatient treatment. Pt is hemodynamically stable and mentating appropriately. Discussed findings and plan with patient/guardian, who agrees with care plan. All questions answered. Return precautions discussed and outpatient follow up given.   New Prescriptions   CEPHALEXIN (KEFLEX) 500 MG CAPSULE    Take 1 capsule (500 mg total) by mouth 2 (two) times daily.   PRENATAL VIT-FE FUMARATE-FA (PRENATAL COMPLETE) 14-0.4 MG TABS    Take 1  tablet by mouth daily.         Wynetta Emeryicole Addysyn Fern, PA-C 08/30/15 1945  Mancel BaleElliott Wentz, MD 08/31/15 [phone]

## 2015-08-31 LAB — URINE CULTURE: Culture: 80000

## 2015-09-01 ENCOUNTER — Telehealth (HOSPITAL_COMMUNITY): Payer: Self-pay

## 2015-09-01 NOTE — Telephone Encounter (Signed)
Post ED Visit - Positive Culture Follow-up: Successful Patient Follow-Up  Culture assessed and recommendations reviewed by: []  Celedonio MiyamotoJeremy Frens, Pharm.D., BCPS-AQ ID []  Georgina PillionElizabeth Martin, Pharm.D., BCPS []  Melvin VillageMinh Pham, [address]Ph[address]D., BCPS, AAHIVP []  Estella HuskMichelle Turner, Pharm.D., BCPS, AAHIVP []  Morganzaristy Reyes, [address]Pharm[address]D. []  Tennis Mustassie Stewart, [place]. Gary FleetX  Nicholas Gazda, Pharm.D.  Positive urine culture,  80,000 colonies yeast & 6,000 colonies -> Group B Strep  [x]  Patient discharged without antimicrobial prescription and treatment is now indicated, Cephalexin []  Organism is resistant to prescribed ED discharge antimicrobial []  Patient with positive blood cultures  Changes discussed with ED provider: H. Muthersbaugh PA New antibiotic prescription "Fluconazole po 150 mg x 1 dose", D/C Keflex  Contacted patient, date 09/01/2015, time 13:25 Pt's phone not accepting calls at this time.   Arvid RightClark, Gayleen Sholtz Dorn 09/01/2015, 1:21 PM

## 2015-09-01 NOTE — Progress Notes (Signed)
ED Antimicrobial Stewardship Positive Culture Follow Up   Heather Whitaker is an 19 y.o. female who [facility] with a chief complaint of  Chief Complaint  Patient presents with  . Abdominal Pain    Recent Results (from the past 720 hour(s))  Urine culture     Status: None   Collection Time: 08/30/15  2:15 PM  Result Value Ref Range Status   Specimen Description URINE, CLEAN CATCH  Final   Special Requests NONE  Final   Culture   Final    80,000 COLONIES/ml YEAST 6,000 COLONIES/mL GROUP B STREP(S.AGALACTIAE)ISOLATED TESTING AGAINST S. AGALACTIAE NOT ROUTINELY PERFORMED DUE TO PREDICTABILITY OF AMP/PEN/VAN SUSCEPTIBILITY.    Report Status 08/31/2015 FINAL  Final    [x]  Treated with Keflex, organism resistant to prescribed antimicrobial  New antibiotic prescription: Fluconazole po 150 mg x 1 dose #1 No Refills  ED Provider: Atha StarksHannah C Muthersbaugh PA-C  19 y/o F with abdominal pain. Pregnancy positive. No fever, chills, or dysuria. Pt grew 6,000 colonies of Group B strep which is not significant for treatment. Also grew 80,000 colonies of yeast. Will d/c Keflex and give one dose of fluconazole.   Sandi CarneNick Evalina Tabak, PharmD Pharmacy Resident Pager: [phone] 09/01/2015, 9:05 AM Infectious Diseases Pharmacist Phone# [phone]

## 2015-09-02 ENCOUNTER — Telehealth (HOSPITAL_COMMUNITY): Payer: Self-pay

## 2015-09-03 ENCOUNTER — Telehealth (HOSPITAL_COMMUNITY): Payer: Self-pay

## 2015-09-03 NOTE — Telephone Encounter (Signed)
Unable to reach by telephone. Letter sent to address on record.  

## 2015-09-07 DIAGNOSIS — R21 Rash and other nonspecific skin eruption: Secondary | ICD-10-CM | POA: Insufficient documentation

## 2015-09-07 DIAGNOSIS — Z79899 Other long term (current) drug therapy: Secondary | ICD-10-CM | POA: Insufficient documentation

## 2015-09-07 DIAGNOSIS — Z792 Long term (current) use of antibiotics: Secondary | ICD-10-CM | POA: Insufficient documentation

## 2015-09-08 ENCOUNTER — Emergency Department (HOSPITAL_COMMUNITY)
Admission: EM | Admit: 2015-09-08 | Discharge: 2015-09-08 | Disposition: A | Discharge status: Home / Self Care | Payer: Medicaid Other | Attending: Emergency Medicine | Admitting: Emergency Medicine

## 2015-09-08 ENCOUNTER — Encounter (HOSPITAL_COMMUNITY): Payer: Self-pay | Admitting: Emergency Medicine

## 2015-09-08 DIAGNOSIS — R21 Rash and other nonspecific skin eruption: Secondary | ICD-10-CM

## 2015-09-08 MED ORDER — PREDNISONE 20 MG PO TABS: ORAL_TABLET | ORAL | Status: DC

## 2015-09-08 MED ORDER — TRIAMCINOLONE ACETONIDE 0.025 % EX OINT: 1.0000 "application " | TOPICAL_OINTMENT | Freq: Two times a day (BID) | CUTANEOUS | Status: DC

## 2015-09-08 NOTE — ED Provider Notes (Signed)
CSN: [id number]     Arrival date & time 09/07/15  2359 History   First MD Initiated Contact with Patient 09/08/15 0021     Chief Complaint  Patient presents with  . Rash     (Consider location/radiation/quality/duration/timing/severity/associated sxs/prior Treatment) HPI  This is a 19 year old female who presents emergency Department with chief complaint of rash. Patient states that she was in house and the dog has fleas. She has multiple flea bites to the arms. She's had 3 bites to her feet and now has a worsening very itchy rash around her ankles. She denies any housemates with similar symptoms. She has any changes in lotions, soaps, detergents. She denies any pain, swelling or redness.  History reviewed. No pertinent past medical history. Past Surgical History  Procedure Laterality Date  . Myringotomy with tube placement     No family history on file. Social History  Substance Use Topics  . Smoking status: Never Smoker   . Smokeless tobacco: None  . Alcohol Use: Yes   OB History    No data available     Review of Systems  Constitutional: Negative for fever.  Skin: Positive for rash. Negative for wound.  All other systems reviewed and are negative.     Allergies  Review of patient's allergies indicates no known allergies.  Home Medications   Prior to Admission medications   Medication Sig Start Date End Date Taking? Authorizing Provider  aspirin-acetaminophen-caffeine (EXCEDRIN EXTRA STRENGTH) [phone] MG per tablet Take 1 tablet by mouth every 6 (six) hours as needed for headache.    Historical Provider, MD  cephALEXin (KEFLEX) 500 MG capsule Take 1 capsule (500 mg total) by mouth 2 (two) times daily. 08/30/15   Nicole Pisciotta, PA-C  Prenatal Vit-Fe Fumarate-FA (PRENATAL COMPLETE) 14-0.4 MG TABS Take 1 tablet by mouth daily. 08/30/15   Nicole Pisciotta, PA-C   BP 123/78 mmHg  Pulse 83  Temp(Src) 98.8 F (37.1 C) (Oral)  Resp 18  SpO2 100%  LMP  07/27/2015 Physical Exam  Constitutional: She is oriented to person, place, and time. She appears well-developed and well-nourished. No distress.  HENT:  Head: Normocephalic and atraumatic.  Eyes: Conjunctivae are normal. No scleral icterus.  Neck: Normal range of motion.  Cardiovascular: Normal rate, regular rhythm and normal heart sounds.  Exam reveals no gallop and no friction rub.   No murmur heard. Pulmonary/Chest: Effort normal and breath sounds normal. No respiratory distress.  Abdominal: Soft. Bowel sounds are normal. She exhibits no distension and no mass. There is no tenderness. There is no guarding.  Neurological: She is alert and oriented to person, place, and time.  Skin: Skin is warm and dry. She is not diaphoretic.  Multiple punctate hyper pigmented and excoriated lesions that aren't consistent with bug bite. Bilateral areas of coalescing vesicles on ankles with excoriation and weeping      ED Course  Procedures (including critical care time) Labs Review Labs Reviewed - No data to display  Imaging Review No results found. I have personally reviewed and evaluated these images and lab results as part of my medical decision-making.   EKG Interpretation None      MDM   Final diagnoses:  Rash and nonspecific skin eruption    Patient with what appears to be allergic and/or contact dermatitis. She has no changes in topical scabies or to experience before. The patient may have an allergy to flea bites. This started on prednisone taper and topical triamcinolone ointment.    Cammy CopaAbigail  Tiburcio Pea, PA-C 09/08/15 0102  Geoffery Lyons, MD 09/08/15 [phone]

## 2015-09-08 NOTE — ED Notes (Signed)
Pt. reports itchy skin rashes at arm , abdomen and feet onset this week , denies fever / respirations unlabored .

## 2015-09-08 NOTE — ED Notes (Signed)
Pt ambulating independently w/ steady gait on d/c in no acute distress, A&Ox4. D/c instructions reviewed w/ pt and family - pt and family deny any further questions or concerns at present. Rx given x2  

## 2015-09-08 NOTE — Discharge Instructions (Signed)
Contact Dermatitis Dermatitis is redness, soreness, and swelling (inflammation) of the skin. Contact dermatitis is a reaction to certain substances that touch the skin. There are two types of contact dermatitis:   Irritant contact dermatitis. This type is caused by something that irritates your skin, such as dry hands from washing them too much. This type does not require previous exposure to the substance for a reaction to occur. This type is more common.  Allergic contact dermatitis. This type is caused by a substance that you are allergic to, such as a nickel allergy or poison ivy. This type only occurs if you have been exposed to the substance (allergen) before. Upon a repeat exposure, your body reacts to the substance. This type is less common. CAUSES  Many different substances can cause contact dermatitis. Irritant contact dermatitis is most commonly caused by exposure to:   Makeup.   Soaps.   Detergents.   Bleaches.   Acids.   Metal salts, such as nickel.  Allergic contact dermatitis is most commonly caused by exposure to:   Poisonous plants.   Chemicals.   Jewelry.   Latex.   Medicines.   Preservatives in products, such as clothing.  RISK FACTORS This condition is more likely to develop in:   People who have jobs that expose them to irritants or allergens.  People who have certain medical conditions, such as asthma or eczema.  SYMPTOMS  Symptoms of this condition may occur anywhere on your body where the irritant has touched you or is touched by you. Symptoms include:  Dryness or flaking.   Redness.   Cracks.   Itching.   Pain or a burning feeling.   Blisters.  Drainage of small amounts of blood or clear fluid from skin cracks. With allergic contact dermatitis, there may also be swelling in areas such as the eyelids, mouth, or genitals.  DIAGNOSIS  This condition is diagnosed with a medical history and physical exam. A patch skin test  may be performed to help determine the cause. If the condition is related to your job, you may need to see an occupational medicine specialist. TREATMENT Treatment for this condition includes figuring out what caused the reaction and protecting your skin from further contact. Treatment may also include:   Steroid creams or ointments. Oral steroid medicines may be needed in more severe cases.  Antibiotics or antibacterial ointments, if a skin infection is present.  Antihistamine lotion or an antihistamine taken by mouth to ease itching.  A bandage (dressing). HOME CARE INSTRUCTIONS Skin Care  Moisturize your skin as needed.   Apply cool compresses to the affected areas.  Try taking a bath with:  Epsom salts. Follow the instructions on the packaging. You can get these at your local pharmacy or grocery store.  Baking soda. Pour a small amount into the bath as directed by your health care provider.  Colloidal oatmeal. Follow the instructions on the packaging. You can get this at your local pharmacy or grocery store.  Try applying baking soda paste to your skin. Stir water into baking soda until it reaches a paste-like consistency.  Do not scratch your skin.  Bathe less frequently, such as every other day.  Bathe in lukewarm water. Avoid using hot water. Medicines  Take or apply over-the-counter and prescription medicines only as told by your health care provider.   If you were prescribed an antibiotic medicine, take or apply your antibiotic as told by your health care provider. Do not stop using the   antibiotic even if your condition starts to improve. General Instructions  Keep all follow-up visits as told by your health care provider. This is important.  Avoid the substance that caused your reaction. If you do not know what caused it, keep a journal to try to track what caused it. Write down:  What you eat.  What cosmetic products you use.  What you drink.  What  you wear in the affected area. This includes jewelry.  If you were given a dressing, take care of it as told by your health care provider. This includes when to change and remove it. SEEK MEDICAL CARE IF:   Your condition does not improve with treatment.  Your condition gets worse.  You have signs of infection such as swelling, tenderness, redness, soreness, or warmth in the affected area.  You have a fever.  You have new symptoms. SEEK IMMEDIATE MEDICAL CARE IF:   You have a severe headache, neck pain, or neck stiffness.  You vomit.  You feel very sleepy.  You notice red streaks coming from the affected area.  Your bone or joint underneath the affected area becomes painful after the skin has healed.  The affected area turns darker.  You have difficulty breathing.   This information is not intended to replace advice given to you by your health care provider. Make sure you discuss any questions you have with your health care provider.   Document Released: 10/25/2000 Document Revised: 07/19/2015 Document Reviewed: 03/15/2015 Elsevier Interactive Patient Education 2016 [employer]  

## 2015-09-16 ENCOUNTER — Telehealth (HOSPITAL_COMMUNITY): Payer: Self-pay

## 2015-09-16 NOTE — Telephone Encounter (Signed)
Unable to contact pt by mail or telephone. Unable to communicate lab results or treatment changes. 

## 2015-11-26 ENCOUNTER — Encounter (HOSPITAL_COMMUNITY): Payer: Self-pay

## 2015-11-26 DIAGNOSIS — Z3202 Encounter for pregnancy test, result negative: Secondary | ICD-10-CM | POA: Insufficient documentation

## 2015-11-26 DIAGNOSIS — N760 Acute vaginitis: Secondary | ICD-10-CM | POA: Insufficient documentation

## 2015-11-26 DIAGNOSIS — Z79899 Other long term (current) drug therapy: Secondary | ICD-10-CM | POA: Insufficient documentation

## 2015-11-26 DIAGNOSIS — Z7952 Long term (current) use of systemic steroids: Secondary | ICD-10-CM | POA: Insufficient documentation

## 2015-11-26 DIAGNOSIS — B373 Candidiasis of vulva and vagina: Secondary | ICD-10-CM | POA: Insufficient documentation

## 2015-11-26 DIAGNOSIS — Z792 Long term (current) use of antibiotics: Secondary | ICD-10-CM | POA: Insufficient documentation

## 2015-11-26 LAB — POC URINE PREG, ED: PREG TEST UR: NEGATIVE

## 2015-11-26 NOTE — ED Notes (Signed)
Pt states she has been having burning with urination and vaginal discharge and odor since December.

## 2015-11-27 ENCOUNTER — Emergency Department (HOSPITAL_COMMUNITY)
Admission: EM | Admit: 2015-11-27 | Discharge: 2015-11-27 | Disposition: A | Discharge status: Home / Self Care | Payer: Medicaid Other | Attending: Emergency Medicine | Admitting: Emergency Medicine

## 2015-11-27 DIAGNOSIS — B9689 Other specified bacterial agents as the cause of diseases classified elsewhere: Secondary | ICD-10-CM

## 2015-11-27 DIAGNOSIS — B3731 Acute candidiasis of vulva and vagina: Secondary | ICD-10-CM

## 2015-11-27 DIAGNOSIS — B373 Candidiasis of vulva and vagina: Secondary | ICD-10-CM

## 2015-11-27 DIAGNOSIS — N760 Acute vaginitis: Secondary | ICD-10-CM

## 2015-11-27 LAB — WET PREP, GENITAL
Sperm: NONE SEEN
Trich, Wet Prep: NONE SEEN

## 2015-11-27 LAB — GC/CHLAMYDIA PROBE AMP (~~LOC~~) NOT AT ARMC
Chlamydia: NEGATIVE
NEISSERIA GONORRHEA: NEGATIVE

## 2015-11-27 LAB — URINALYSIS, ROUTINE W REFLEX MICROSCOPIC
Bilirubin Urine: NEGATIVE
GLUCOSE, UA: NEGATIVE mg/dL
HGB URINE DIPSTICK: NEGATIVE
Ketones, ur: NEGATIVE mg/dL
Nitrite: NEGATIVE
Protein, ur: 30 mg/dL — AB
SPECIFIC GRAVITY, URINE: 1.036 — AB (ref 1.005–1.030)
pH: 6.5 (ref 5.0–8.0)

## 2015-11-27 LAB — URINE MICROSCOPIC-ADD ON

## 2015-11-27 MED ORDER — CEFTRIAXONE SODIUM 250 MG IJ SOLR
250.0000 mg | Freq: Once | INTRAMUSCULAR | Status: AC
  Administered 2015-11-27: 250 mg via INTRAMUSCULAR
  Filled 2015-11-27: qty 250

## 2015-11-27 MED ORDER — FLUCONAZOLE 200 MG PO TABS: 200.0000 mg | ORAL_TABLET | Freq: Every day | ORAL | Status: AC

## 2015-11-27 MED ORDER — AZITHROMYCIN 250 MG PO TABS
1000.0000 mg | ORAL_TABLET | Freq: Once | ORAL | Status: AC
  Administered 2015-11-27: 1000 mg via ORAL
  Filled 2015-11-27: qty 4

## 2015-11-27 MED ORDER — LIDOCAINE HCL (PF) 1 % IJ SOLN
0.9000 mL | Freq: Once | INTRAMUSCULAR | Status: AC
  Administered 2015-11-27: 0.9 mL

## 2015-11-27 MED ORDER — METRONIDAZOLE 500 MG PO TABS: 500.0000 mg | ORAL_TABLET | Freq: Two times a day (BID) | ORAL | Status: DC

## 2015-11-27 MED ORDER — ONDANSETRON 4 MG PO TBDP
4.0000 mg | ORAL_TABLET | Freq: Once | ORAL | Status: AC
  Administered 2015-11-27: 4 mg via ORAL
  Filled 2015-11-27: qty 1

## 2015-11-27 NOTE — ED Provider Notes (Signed)
CSN: [id number]     Arrival date & time 11/26/15  2256 History   First MD Initiated Contact with Patient 11/27/15 [phone]     Chief Complaint  Patient presents with  . Dysuria  . Vaginal Discharge     (Consider location/radiation/quality/duration/timing/severity/associated sxs/prior Treatment) HPI   Patient to the ER with complaints of burning with urination and vaginal discharge for the past month. She denies being seen for this before today. She comes to the ER today because the odor has become worse and bothersome. She denies having any N/V/D, back pain, fevers, weakness fatigue. Denies abdominal pains. No systemic symptoms. She is sexually active in a long term monogamous relationship and does not always use protection.  History reviewed. No pertinent past medical history. Past Surgical History  Procedure Laterality Date  . Myringotomy with tube placement     No family history on file. Social History  Substance Use Topics  . Smoking status: Never Smoker   . Smokeless tobacco: None  . Alcohol Use: Yes   OB History    No data available     Review of Systems  Review of Systems All other systems negative except as documented in the HPI. All pertinent positives and negatives as reviewed in the HPI.   Allergies  Review of patient's allergies indicates no known allergies.  Home Medications   Prior to Admission medications   Medication Sig Start Date End Date Taking? Authorizing Provider  aspirin-acetaminophen-caffeine (EXCEDRIN EXTRA STRENGTH) [phone] MG per tablet Take 1 tablet by mouth every 6 (six) hours as needed for headache.    Historical Provider, MD  cephALEXin (KEFLEX) 500 MG capsule Take 1 capsule (500 mg total) by mouth 2 (two) times daily. 08/30/15   Nicole Pisciotta, PA-C  fluconazole (DIFLUCAN) 200 MG tablet Take 1 tablet (200 mg total) by mouth daily. 11/27/15 12/04/15  Jachin Coury Neva Seat, PA-C  metroNIDAZOLE (FLAGYL) 500 MG tablet Take 1 tablet (500 mg total) by  mouth 2 (two) times daily. 11/27/15   Ayinde Swim Neva Seat, PA-C  predniSONE (DELTASONE) 20 MG tablet 3 tabs po daily x 3 days, then 2 tabs x 3 days, then 1.5 tabs x 3 days, then 1 tab x 3 days, then 0.5 tabs x 3 days 09/08/15   Arthor Captain, PA-C  Prenatal Vit-Fe Fumarate-FA (PRENATAL COMPLETE) 14-0.4 MG TABS Take 1 tablet by mouth daily. 08/30/15   Nicole Pisciotta, PA-C  triamcinolone (KENALOG) 0.025 % ointment Apply 1 application topically 2 (two) times daily. 09/08/15   Abigail Harris, PA-C   BP 106/75 mmHg  Pulse 71  Temp(Src) 98.5 F (36.9 C) (Oral)  Resp 18  SpO2 100%  LMP 11/06/2015 Physical Exam  Constitutional: She appears well-developed and well-nourished. No distress.  HENT:  Head: Normocephalic and atraumatic.  Right Ear: Tympanic membrane and ear canal normal.  Left Ear: Tympanic membrane and ear canal normal.  Nose: Nose normal.  Mouth/Throat: Uvula is midline, oropharynx is clear and moist and mucous membranes are normal.  Eyes: Pupils are equal, round, and reactive to light.  Neck: Normal range of motion. Neck supple.  Cardiovascular: Normal rate and regular rhythm.   Pulmonary/Chest: Effort normal.  Abdominal: Soft.  No signs of abdominal distention  Genitourinary: Cervix exhibits no motion tenderness. Right adnexum displays no mass and no tenderness. Left adnexum displays no mass and no tenderness. No bleeding in the vagina. No foreign body around the vagina. Vaginal discharge (white and chunky) found.  Musculoskeletal:  No LE swelling  Neurological: She is alert.  Acting at baseline  Skin: Skin is warm and dry. No rash noted.  Nursing note and vitals reviewed.   ED Course  Procedures (including critical care time) Labs Review Labs Reviewed  WET PREP, GENITAL - Abnormal; Notable for the following:    Yeast Wet Prep HPF POC PRESENT (*)    Clue Cells Wet Prep HPF POC PRESENT (*)    WBC, Wet Prep HPF POC MANY (*)    All other components within normal limits   URINALYSIS, ROUTINE W REFLEX MICROSCOPIC (NOT AT [facility]) - Abnormal; Notable for the following:    APPearance CLOUDY (*)    Specific Gravity, Urine 1.036 (*)    Protein, ur 30 (*)    Leukocytes, UA MODERATE (*)    All other components within normal limits  URINE MICROSCOPIC-ADD ON - Abnormal; Notable for the following:    Squamous Epithelial / LPF 6-30 (*)    Bacteria, UA MANY (*)    All other components within normal limits  URINE CULTURE  POC URINE PREG, ED  GC/CHLAMYDIA PROBE AMP ([place]) NOT AT [facility]    Imaging Review No results found. I have personally reviewed and evaluated these images and lab results as part of my medical decision-making.   EKG Interpretation None      MDM   Final diagnoses:  Vaginal yeast infection  Bacterial vaginosis    UA is likely contaminated will send out urine culture. WET prep shows YEAST present, CLUE CELLS present and MANY WBC.  Will give Azithromycin and Rocephin in the ED today Will rx Flagyl and Diflucan for home.  [facility] to have urine and pelvic rechecked to ensure resolution of infection.   Otherwise she is well appearing and without any systemic symptoms or tenderness on pelvic exam.  Advised to practice safe sex and have all partners evaluated and treated at the local health department. Also advised to follow with the health Department in 1-2 weeks to confirm effectiveness of treatment and receive additional education/evaluation. Return precautions given.      Marlon Peliffany Ehab Humber, PA-C 11/27/15 0413  Laurence Spatesachel Morgan Little, MD 11/27/15 [phone]

## 2015-11-27 NOTE — ED Notes (Signed)
Patient is alert and orientedx4.  Patient was explained discharge instructions and they understood them with no questions.   

## 2015-11-27 NOTE — Discharge Instructions (Signed)
Bacterial Vaginosis Bacterial vaginosis is a vaginal infection that occurs when the normal balance of bacteria in the vagina is disrupted. It results from an overgrowth of certain bacteria. This is the most common vaginal infection in women of childbearing age. Treatment is important to prevent complications, especially in pregnant women, as it can cause a premature delivery. CAUSES  Bacterial vaginosis is caused by an increase in harmful bacteria that are normally present in smaller amounts in the vagina. Several different kinds of bacteria can cause bacterial vaginosis. However, the reason that the condition develops is not fully understood. RISK FACTORS Certain activities or behaviors can put you at an increased risk of developing bacterial vaginosis, including:  Having a new sex partner or multiple sex partners.  Douching.  Using an intrauterine device (IUD) for contraception. Women do not get bacterial vaginosis from toilet seats, bedding, swimming pools, or contact with objects around them. SIGNS AND SYMPTOMS  Some women with bacterial vaginosis have no signs or symptoms. Common symptoms include:  Grey vaginal discharge.  A fishlike odor with discharge, especially after sexual intercourse.  Itching or burning of the vagina and vulva.  Burning or pain with urination. DIAGNOSIS  Your health care provider will take a medical history and examine the vagina for signs of bacterial vaginosis. A sample of vaginal fluid may be taken. Your health care provider will look at this sample under a microscope to check for bacteria and abnormal cells. A vaginal pH test may also be done.  TREATMENT  Bacterial vaginosis may be treated with antibiotic medicines. These may be given in the form of a pill or a vaginal cream. A second round of antibiotics may be prescribed if the condition comes back after treatment. Because bacterial vaginosis increases your risk for sexually transmitted diseases, getting  treated can help reduce your risk for chlamydia, gonorrhea, HIV, and herpes. HOME CARE INSTRUCTIONS   Only take over-the-counter or prescription medicines as directed by your health care provider.  If antibiotic medicine was prescribed, take it as directed. Make sure you finish it even if you start to feel better.  Tell all sexual partners that you have a vaginal infection. They should see their health care provider and be treated if they have problems, such as a mild rash or itching.  During treatment, it is important that you follow these instructions:  Avoid sexual activity or use condoms correctly.  Do not douche.  Avoid alcohol as directed by your health care provider.  Avoid breastfeeding as directed by your health care provider. SEEK MEDICAL CARE IF:   Your symptoms are not improving after 3 days of treatment.  You have increased discharge or pain.  You have a fever. MAKE SURE YOU:   Understand these instructions.  Will watch your condition.  Will get help right away if you are not doing well or get worse. FOR MORE INFORMATION  Centers for Disease Control and Prevention, Division of STD Prevention: www.cdc.gov/std American Sexual Health Association (ASHA): www.ashastd.org    This information is not intended to replace advice given to you by your health care provider. Make sure you discuss any questions you have with your health care provider.   Document Released: 10/28/2005 Document Revised: 11/18/2014 Document Reviewed: 06/09/2013 Elsevier Interactive Patient Education 2016 Elsevier Inc. Monilial Vaginitis Vaginitis in a soreness, swelling and redness (inflammation) of the vagina and vulva. Monilial vaginitis is not a sexually transmitted infection. CAUSES  Yeast vaginitis is caused by yeast (candida) that is normally found in   your vagina. With a yeast infection, the candida has overgrown in number to a point that upsets the chemical balance. SYMPTOMS   White,  thick vaginal discharge.  Swelling, itching, redness and irritation of the vagina and possibly the lips of the vagina (vulva).  Burning or painful urination.  Painful intercourse. DIAGNOSIS  Things that may contribute to monilial vaginitis are:  Postmenopausal and virginal states.  Pregnancy.  Infections.  Being tired, sick or stressed, especially if you had monilial vaginitis in the past.  Diabetes. Good control will help lower the chance.  Birth control pills.  Tight fitting garments.  Using bubble bath, feminine sprays, douches or deodorant tampons.  Taking certain medications that kill germs (antibiotics).  Sporadic recurrence can occur if you become ill. TREATMENT  Your caregiver will give you medication.  There are several kinds of anti monilial vaginal creams and suppositories specific for monilial vaginitis. For recurrent yeast infections, use a suppository or cream in the vagina 2 times a week, or as directed.  Anti-monilial or steroid cream for the itching or irritation of the vulva may also be used. Get your caregiver's permission.  Painting the vagina with methylene blue solution may help if the monilial cream does not work.  Eating yogurt may help prevent monilial vaginitis. HOME CARE INSTRUCTIONS   Finish all medication as prescribed.  Do not have sex until treatment is completed or after your caregiver tells you it is okay.  Take warm sitz baths.  Do not douche.  Do not use tampons, especially scented ones.  Wear cotton underwear.  Avoid tight pants and panty hose.  Tell your sexual partner that you have a yeast infection. They should go to their caregiver if they have symptoms such as mild rash or itching.  Your sexual partner should be treated as well if your infection is difficult to eliminate.  Practice safer sex. Use condoms.  Some vaginal medications cause latex condoms to fail. Vaginal medications that harm condoms are:  Cleocin  cream.  Butoconazole (Femstat).  Terconazole (Terazol) vaginal suppository.  Miconazole (Monistat) (may be purchased over the counter). SEEK MEDICAL CARE IF:   You have a temperature by mouth above 102 F (38.9 C).  The infection is getting worse after 2 days of treatment.  The infection is not getting better after 3 days of treatment.  You develop blisters in or around your vagina.  You develop vaginal bleeding, and it is not your menstrual period.  You have pain when you urinate.  You develop intestinal problems.  You have pain with sexual intercourse.   This information is not intended to replace advice given to you by your health care provider. Make sure you discuss any questions you have with your health care provider.   Document Released: 08/07/2005 Document Revised: 01/20/2012 Document Reviewed: 05/01/2015 Elsevier Interactive Patient Education 2016 Elsevier Inc.  

## 2015-11-28 LAB — URINE CULTURE

## 2016-01-26 ENCOUNTER — Emergency Department (HOSPITAL_COMMUNITY)
Admission: EM | Admit: 2016-01-26 | Discharge: 2016-01-27 | Disposition: A | Discharge status: Home / Self Care | Payer: Medicaid Other | Attending: Emergency Medicine | Admitting: Emergency Medicine

## 2016-01-26 ENCOUNTER — Encounter (HOSPITAL_COMMUNITY): Payer: Self-pay

## 2016-01-26 DIAGNOSIS — Z7982 Long term (current) use of aspirin: Secondary | ICD-10-CM | POA: Insufficient documentation

## 2016-01-26 DIAGNOSIS — Z7952 Long term (current) use of systemic steroids: Secondary | ICD-10-CM | POA: Insufficient documentation

## 2016-01-26 DIAGNOSIS — Z3202 Encounter for pregnancy test, result negative: Secondary | ICD-10-CM | POA: Insufficient documentation

## 2016-01-26 DIAGNOSIS — B3731 Acute candidiasis of vulva and vagina: Secondary | ICD-10-CM

## 2016-01-26 DIAGNOSIS — N898 Other specified noninflammatory disorders of vagina: Secondary | ICD-10-CM

## 2016-01-26 DIAGNOSIS — B373 Candidiasis of vulva and vagina: Secondary | ICD-10-CM

## 2016-01-26 DIAGNOSIS — Z792 Long term (current) use of antibiotics: Secondary | ICD-10-CM | POA: Insufficient documentation

## 2016-01-26 LAB — URINE MICROSCOPIC-ADD ON: RBC / HPF: NONE SEEN RBC/hpf (ref 0–5)

## 2016-01-26 LAB — URINALYSIS, ROUTINE W REFLEX MICROSCOPIC
Bilirubin Urine: NEGATIVE
Glucose, UA: NEGATIVE mg/dL
Hgb urine dipstick: NEGATIVE
Ketones, ur: NEGATIVE mg/dL
NITRITE: NEGATIVE
PH: 5.5 (ref 5.0–8.0)
Protein, ur: 30 mg/dL — AB
Specific Gravity, Urine: 1.034 — ABNORMAL HIGH (ref 1.005–1.030)

## 2016-01-26 LAB — POC URINE PREG, ED: PREG TEST UR: NEGATIVE

## 2016-01-26 LAB — WET PREP, GENITAL
Clue Cells Wet Prep HPF POC: NONE SEEN
SPERM: NONE SEEN
TRICH WET PREP: NONE SEEN
Yeast Wet Prep HPF POC: NONE SEEN

## 2016-01-26 MED ORDER — FLUCONAZOLE 150 MG PO TABS: 150.0000 mg | ORAL_TABLET | Freq: Once | ORAL | Status: DC

## 2016-01-26 MED ORDER — FLUCONAZOLE 100 MG PO TABS
150.0000 mg | ORAL_TABLET | Freq: Once | ORAL | Status: AC
  Administered 2016-01-26: 150 mg via ORAL
  Filled 2016-01-26: qty 2

## 2016-01-26 NOTE — ED Notes (Signed)
Pt here for vaginal discharge. She reports she was seen here and treated for BV in January but reports the discharge reoccurred a month ago. Denies vaginal bleeding, painful intercourse or abdominal pain.

## 2016-01-26 NOTE — ED Notes (Signed)
No answer in waiting room 

## 2016-01-26 NOTE — ED Provider Notes (Signed)
CSN: [id number]     Arrival date & time 01/26/16  2026 History   First MD Initiated Contact with Patient 01/26/16 2215     Chief Complaint  Patient presents with  . Vaginal Discharge     (Consider location/radiation/quality/duration/timing/severity/associated sxs/prior Treatment) HPI Comments: Heather Whitaker is a 20 y.o. female, who presents to the ED with complaints of one month of ongoing vaginal discharge which she states is thick white creamy with the occasional more mucoid discharge. She states on 11/27/2015 she was seen in the ER, chart review reveals that testing revealed yeast and BV, she was treated with Flagyl and Diflucan with good resolution of her symptoms, GC/CT testing at that time was negative. She states that approximately 1 month ago her symptoms returned and she believes she has another bacterial vaginosis and yeast infection. She endorses vaginal itching. She has been sexually active with 2 female partners in the last 1 year, unprotected. LMP was 3 weeks ago. She denies any fevers, chills, chest pain, shortness breath, abdominal pain, nausea, vomiting, diarrhea, constipation, urinary symptoms, urinary frequency, vaginal bleeding, genital lesions, numbness, tingling, or focal weakness.  Patient is a 20 y.o. female presenting with vaginal discharge. The history is provided by the patient and medical records. No language interpreter was used.  Vaginal Discharge Quality:  Thick and white Severity:  Moderate Onset quality:  Gradual Duration:  1 month Timing:  Constant Progression:  Unchanged Chronicity:  Recurrent Context: spontaneously   Relieved by:  None tried Worsened by:  Nothing tried Ineffective treatments:  None tried Associated symptoms: vaginal itching   Associated symptoms: no abdominal pain, no dysuria, no fever, no nausea, no urinary frequency and no vomiting   Risk factors: unprotected sex     History reviewed. No pertinent past medical history. Past Surgical  History  Procedure Laterality Date  . Myringotomy with tube placement     No family history on file. Social History  Substance Use Topics  . Smoking status: Never Smoker   . Smokeless tobacco: None  . Alcohol Use: Yes   OB History    No data available     Review of Systems  Constitutional: Negative for fever and chills.  Respiratory: Negative for shortness of breath.   Cardiovascular: Negative for chest pain.  Gastrointestinal: Negative for nausea, vomiting, abdominal pain, diarrhea and constipation.  Genitourinary: Positive for vaginal discharge and vaginal pain (itching). Negative for dysuria, hematuria, vaginal bleeding and genital sores.  Musculoskeletal: Negative for myalgias and arthralgias.  Skin: Negative for color change.  Allergic/Immunologic: Negative for immunocompromised state.  Neurological: Negative for weakness and numbness.  Psychiatric/Behavioral: Negative for confusion.   10 Systems reviewed and are negative for acute change except as noted in the HPI.    Allergies  Review of patient's allergies indicates no known allergies.  Home Medications   Prior to Admission medications   Medication Sig Start Date End Date Taking? Authorizing Provider  aspirin-acetaminophen-caffeine (EXCEDRIN EXTRA STRENGTH) [phone] MG per tablet Take 1 tablet by mouth every 6 (six) hours as needed for headache.    Historical Provider, MD  cephALEXin (KEFLEX) 500 MG capsule Take 1 capsule (500 mg total) by mouth 2 (two) times daily. 08/30/15   Nicole Pisciotta, PA-C  metroNIDAZOLE (FLAGYL) 500 MG tablet Take 1 tablet (500 mg total) by mouth 2 (two) times daily. 11/27/15   Tiffany Neva Seat, PA-C  predniSONE (DELTASONE) 20 MG tablet 3 tabs po daily x 3 days, then 2 tabs x 3 days, then 1.5 tabs  x 3 days, then 1 tab x 3 days, then 0.5 tabs x 3 days 09/08/15   Arthor Captain, PA-C  Prenatal Vit-Fe Fumarate-FA (PRENATAL COMPLETE) 14-0.4 MG TABS Take 1 tablet by mouth daily. 08/30/15   Nicole  Pisciotta, PA-C  triamcinolone (KENALOG) 0.025 % ointment Apply 1 application topically 2 (two) times daily. 09/08/15   Abigail Harris, PA-C   BP 118/74 mmHg  Pulse 92  Temp(Src) 98.8 F (37.1 C) (Oral)  Resp 20  SpO2 100%  LMP  (LMP Unknown) Physical Exam  Constitutional: She is oriented to person, place, and time. Vital signs are normal. She appears well-developed and well-nourished.  Non-toxic appearance. No distress.  Afebrile, nontoxic, NAD  HENT:  Head: Normocephalic and atraumatic.  Mouth/Throat: Oropharynx is clear and moist and mucous membranes are normal.  Eyes: Conjunctivae and EOM are normal. Right eye exhibits no discharge. Left eye exhibits no discharge.  Neck: Normal range of motion. Neck supple.  Cardiovascular: Normal rate, regular rhythm, normal heart sounds and intact distal pulses.  Exam reveals no gallop and no friction rub.   No murmur heard. Pulmonary/Chest: Effort normal and breath sounds normal. No respiratory distress. She has no decreased breath sounds. She has no wheezes. She has no rhonchi. She has no rales.  Abdominal: Soft. Normal appearance and bowel sounds are normal. She exhibits no distension. There is no tenderness. There is no rigidity, no rebound, no guarding, no CVA tenderness, no tenderness at McBurney's point and negative Murphy's sign.  Soft, NTND, +BS throughout, no r/g/r, neg murphy's, neg mcburney's, no CVA TTP   Genitourinary: Uterus normal. Pelvic exam was performed with patient supine. There is no rash, tenderness or lesion on the right labia. There is no rash, tenderness or lesion on the left labia. Cervix exhibits no motion tenderness, no discharge and no friability. Right adnexum displays no mass, no tenderness and no fullness. Left adnexum displays no mass, no tenderness and no fullness. There is erythema in the vagina. No bleeding in the vagina. Vaginal discharge found.  Chaperone present for exam. No rashes, lesions, or tenderness to  external genitalia. +Erythema of vaginal mucosa, without injury or tenderness to vaginal mucosa. +Thick white vaginal discharge adhered to vaginal vault, with some thin greyish discharge noted in vaginal vault as well, without bleeding within vaginal vault. No  adnexal masses, tenderness, or fullness. No CMT, cervical friability, or discharge from cervical os. Uterus non-deviated, mobile, nonTTP, and without enlargement.    Musculoskeletal: Normal range of motion.  Neurological: She is alert and oriented to person, place, and time. She has normal strength. No sensory deficit.  Skin: Skin is warm, dry and intact. No rash noted.  Psychiatric: She has a normal mood and affect.  Nursing note and vitals reviewed.   ED Course  Procedures (including critical care time) Labs Review Labs Reviewed  WET PREP, GENITAL - Abnormal; Notable for the following:    WBC, Wet Prep HPF POC MODERATE (*)    All other components within normal limits  URINALYSIS, ROUTINE W REFLEX MICROSCOPIC (NOT AT [facility]) - Abnormal; Notable for the following:    APPearance CLOUDY (*)    Specific Gravity, Urine 1.034 (*)    Protein, ur 30 (*)    Leukocytes, UA MODERATE (*)    All other components within normal limits  URINE MICROSCOPIC-ADD ON - Abnormal; Notable for the following:    Squamous Epithelial / LPF 6-30 (*)    Bacteria, UA RARE (*)    All other components  within normal limits  POC URINE PREG, ED  GC/CHLAMYDIA PROBE AMP ([place]) NOT AT [facility]    Imaging Review No results found. I have personally reviewed and evaluated these images and lab results as part of my medical decision-making.   EKG Interpretation None      MDM   Final diagnoses:  Yeast vaginitis  Vaginal discharge  Vaginal itching    20 y.o. female here with 1 month of vaginal discharge and itching, had similar symptoms on 11/27/15, testing showed BV and yeast, GC/CT testing was neg then. No bleeding or abdominal pain, no abdominal  tenderness. No urinary complaints. Upreg neg, U/A showing 6-30 squamous, rare bacteria, 6-30 WBC likely from vaginal contaminant, and +yeast. Symptoms most consistent with yeast. Pelvic exam revealed thick white discharge with some thin grey discharge as well, no cervical discharge and no CMT, doubt PID, and doubt need for empiric GC/CT treatment given neg testing 1 month ago and no concerning findings to suggest this. Will give diflucan here and then likely send home with one more dose in 3 days, will await wet prep then reassess  12:00 AM Wet prep showing no clue cells, no trich. No yeast seen but clinically this is yeast and based on the U/A there is yeast present. Moderate WBC likely from yeast vaginitis. Will send home with second diflucan dose to be used on 01/29/16, f/up with PCP in 1wk. Discussed abstaining from sex until she finds out about GC/CT testing. Discussed avoidance of douche use or other objects into her vagina that could cause symptoms to worsen. I explained the diagnosis and have given explicit precautions to return to the ER including for any other new or worsening symptoms. The patient understands and accepts the medical plan as it's been dictated and I have answered their questions. Discharge instructions concerning home care and prescriptions have been given. The patient is STABLE and is discharged to home in good condition.  BP 118/74 mmHg  Pulse 92  Temp(Src) 98.8 F (37.1 C) (Oral)  Resp 20  SpO2 100%  LMP  (LMP Unknown)  Meds ordered this encounter  Medications  . fluconazole (DIFLUCAN) tablet 150 mg    Sig:   . fluconazole (DIFLUCAN) 150 MG tablet    Sig: Take 1 tablet (150 mg total) by mouth once. Take on 01/29/16    Dispense:  1 tablet    Refill:  0    Order Specific Question:  Supervising Provider    Answer:  Eber HongMILLER, BRIAN [[id number]]     Talayah Picardi Camprubi-Soms, PA-C 01/27/16 0002  Gilda Creasehristopher J Pollina, MD 01/27/16 0010

## 2016-01-27 NOTE — ED Notes (Signed)
Mercedes, PA at bedside at this time.  

## 2016-01-27 NOTE — Discharge Instructions (Signed)
You have been tested for gonorrhea and chlamydia in the ER, so the hospital will call you if lab is positive. You were treated for yeast infection, but you will need to take the second pill of diflucan on 01/29/16 to complete the therapy. DO NOT ENGAGE IN SEXUAL ACTIVITY UNTIL YOU FIND OUT ABOUT YOUR RESULTS FOR THE GONORRHEA AND CHLAMYDIA TESTING. ALL PARTNERS MUST BE TESTED AND TREATED FOR STD'S BEFORE YOU ENGAGE IN SEXUAL ACTIVITY WITH THEM AGAIN. Follow up with your regular doctor in 5-7 days for recheck of symptoms and ongoing management of your recurrent yeast infections**. Follow up with [facility] STD clinic for future STD concerns or screenings. Return to the ER for changes or worsening symptoms.   Monilial Vaginitis Vaginitis in a soreness, swelling and redness (inflammation) of the vagina and vulva. Monilial vaginitis is not a sexually transmitted infection. CAUSES  Yeast vaginitis is caused by yeast (candida) that is normally found in your vagina. With a yeast infection, the candida has overgrown in number to a point that upsets the chemical balance. SYMPTOMS   White, thick vaginal discharge.  Swelling, itching, redness and irritation of the vagina and possibly the lips of the vagina (vulva).  Burning or painful urination.  Painful intercourse. DIAGNOSIS  Things that may contribute to monilial vaginitis are:  Postmenopausal and virginal states.  Pregnancy.  Infections.  Being tired, sick or stressed, especially if you had monilial vaginitis in the past.  Diabetes. Good control will help lower the chance.  Birth control pills.  Tight fitting garments.  Using bubble bath, feminine sprays, douches or deodorant tampons.  Taking certain medications that kill germs (antibiotics).  Sporadic recurrence can occur if you become ill. TREATMENT  Your caregiver will give you medication.  There are several kinds of anti monilial vaginal creams and  suppositories specific for monilial vaginitis. For recurrent yeast infections, use a suppository or cream in the vagina 2 times a week, or as directed.  Anti-monilial or steroid cream for the itching or irritation of the vulva may also be used. Get your caregiver's permission.  Painting the vagina with methylene blue solution may help if the monilial cream does not work.  Eating yogurt may help prevent monilial vaginitis. HOME CARE INSTRUCTIONS   Finish all medication as prescribed.  Do not have sex until treatment is completed or after your caregiver tells you it is okay.  Take warm sitz baths.  Do not douche.  Do not use tampons, especially scented ones.  Wear cotton underwear.  Avoid tight pants and panty hose.  Tell your sexual partner that you have a yeast infection. They should go to their caregiver if they have symptoms such as mild rash or itching.  Your sexual partner should be treated as well if your infection is difficult to eliminate.  Practice safer sex. Use condoms.  Some vaginal medications cause latex condoms to fail. Vaginal medications that harm condoms are:  Cleocin cream.  Butoconazole (Femstat).  Terconazole (Terazol) vaginal suppository.  Miconazole (Monistat) (may be purchased over the counter). SEEK MEDICAL CARE IF:   You have a temperature by mouth above 102 F (38.9 C).  The infection is getting worse after 2 days of treatment.  The infection is not getting better after 3 days of treatment.  You develop blisters in or around your vagina.  You develop vaginal bleeding, and it is not your menstrual period.  You have pain when you urinate.  You develop intestinal problems.  You  have pain with sexual intercourse.   This information is not intended to replace advice given to you by your health care provider. Make sure you discuss any questions you have with your health care provider.   Document Released: 08/07/2005 Document Revised:  01/20/2012 Document Reviewed: 05/01/2015 Elsevier Interactive Patient Education 2016 [employer]  Vaginitis Vaginitis is an inflammation of the vagina. It can happen when the normal bacteria and yeast in the vagina grow too much. There are different types. Treatment will depend on the type you have. HOME CARE  Take all medicines as told by your doctor.  Keep your vagina area clean and dry. Avoid soap. Rinse the area with water.  Avoid washing and cleaning out the vagina (douching).  Do not use tampons or have sex (intercourse) until your treatment is done.  Wipe from front to back after going to the restroom.  Wear cotton underwear.  Avoid wearing underwear while you sleep until your vaginitis is gone.  Avoid tight pants. Avoid underwear or nylons without a cotton panel.  Take off wet clothing (such as a bathing suit) as soon as you can.  Use mild, unscented products. Avoid fabric softeners and scented:  Feminine sprays.  Laundry detergents.  Tampons.  Soaps or bubble baths.  Practice safe sex and use condoms. GET HELP RIGHT AWAY IF:   You have belly (abdominal) pain.  You have a fever or lasting symptoms for more than 2-3 days.  You have a fever and your symptoms suddenly get worse. MAKE SURE YOU:   Understand these instructions.  Will watch this condition.  Will get help right away if you are not doing well or get worse.   This information is not intended to replace advice given to you by your health care provider. Make sure you discuss any questions you have with your health care provider.   Document Released: 01/24/2009 Document Revised: 07/22/2012 Document Reviewed: 04/09/2012 Elsevier Interactive Patient Education [employer]2016 Elsevier Inc.

## 2016-01-29 LAB — GC/CHLAMYDIA PROBE AMP (~~LOC~~) NOT AT ARMC
Chlamydia: NEGATIVE
Neisseria Gonorrhea: NEGATIVE

## 2016-07-26 ENCOUNTER — Encounter (HOSPITAL_COMMUNITY): Payer: Self-pay | Admitting: Emergency Medicine

## 2016-07-26 ENCOUNTER — Emergency Department (HOSPITAL_COMMUNITY)
Admission: EM | Admit: 2016-07-26 | Discharge: 2016-07-27 | Disposition: A | Discharge status: Home / Self Care | Payer: Medicaid Other | Attending: Emergency Medicine | Admitting: Emergency Medicine

## 2016-07-26 DIAGNOSIS — Z202 Contact with and (suspected) exposure to infections with a predominantly sexual mode of transmission: Secondary | ICD-10-CM | POA: Insufficient documentation

## 2016-07-26 DIAGNOSIS — B373 Candidiasis of vulva and vagina: Secondary | ICD-10-CM

## 2016-07-26 DIAGNOSIS — Z7982 Long term (current) use of aspirin: Secondary | ICD-10-CM | POA: Insufficient documentation

## 2016-07-26 DIAGNOSIS — Z711 Person with feared health complaint in whom no diagnosis is made: Secondary | ICD-10-CM

## 2016-07-26 DIAGNOSIS — B3731 Acute candidiasis of vulva and vagina: Secondary | ICD-10-CM

## 2016-07-26 LAB — URINALYSIS, ROUTINE W REFLEX MICROSCOPIC
BILIRUBIN URINE: NEGATIVE
Glucose, UA: NEGATIVE mg/dL
Hgb urine dipstick: NEGATIVE
KETONES UR: NEGATIVE mg/dL
Leukocytes, UA: NEGATIVE
NITRITE: NEGATIVE
PH: 6.5 (ref 5.0–8.0)
Protein, ur: NEGATIVE mg/dL
Specific Gravity, Urine: 1.029 (ref 1.005–1.030)

## 2016-07-26 LAB — POC URINE PREG, ED: Preg Test, Ur: NEGATIVE

## 2016-07-26 NOTE — ED Provider Notes (Signed)
[facility]-EMERGENCY DEPT Provider Note   CSN: [id number] Arrival date & time: 07/26/16  2018     History   Chief Complaint Chief Complaint  Patient presents with  . Vaginal Itching    HPI Heather Whitaker is a 20 y.o. female with a hx of vaginal yeast infections presents to the Emergency Department complaining of gradual, persistent, progressively worsening white discharge with vaginal discharge onset 1 month.  Slight odor, but denies fishy odor.  LMP was 2 weeks ago.  Sexually active 1 female partner.  She is not using contraception. No treatments PTA.  No aggravating or alleviating factors.  Pt has been treated for yeast infections in the past and ssx are similar today.  Pt denies fever, chills, abd pain, N/V/D.    The history is provided by the patient and medical records. No language interpreter was used.    History reviewed. No pertinent past medical history.  There are no active problems to display for this patient.   Past Surgical History:  Procedure Laterality Date  . MYRINGOTOMY WITH TUBE PLACEMENT      OB History    No data available       Home Medications    Prior to Admission medications   Medication Sig Start Date End Date Taking? Authorizing Provider  aspirin-acetaminophen-caffeine (EXCEDRIN EXTRA STRENGTH) [phone] MG per tablet Take 1 tablet by mouth every 6 (six) hours as needed for headache.    Historical Provider, MD  fluconazole (DIFLUCAN) 150 MG tablet Take 1 tablet (150 mg total) by mouth once. If symptoms persist after 3 days 07/27/16 07/27/16  Dahlia Client Shahid Flori, PA-C    Family History No family history on file.  Social History Social History  Substance Use Topics  . Smoking status: Never Smoker  . Smokeless tobacco: Not on file  . Alcohol use Yes     Allergies   Review of patient's allergies indicates no known allergies.   Review of Systems Review of Systems  Genitourinary: Positive for vaginal discharge.       Vaginal itching  All other  systems reviewed and are negative.    Physical Exam Updated Vital Signs BP 96/65 (BP Location: Left Arm)   Pulse 84   Temp 98 F (36.7 C) (Oral)   Resp 18   Ht 5\' 5"  (1.651 m)   Wt 54.9 kg   SpO2 100%   BMI 20.14 kg/m   Physical Exam  Constitutional: She appears well-developed and well-nourished. No distress.  HENT:  Head: Normocephalic and atraumatic.  Eyes: Conjunctivae are normal.  Neck: Normal range of motion.  Cardiovascular: Normal rate, regular rhythm, normal heart sounds and intact distal pulses.   No murmur heard. Pulmonary/Chest: Effort normal and breath sounds normal. No respiratory distress. She has no wheezes.  Abdominal: Soft. Bowel sounds are normal. There is no tenderness. There is no rebound and no guarding. Hernia confirmed negative in the right inguinal area and confirmed negative in the left inguinal area.  Genitourinary: Uterus normal. No labial fusion. There is no rash, tenderness or lesion on the right labia. There is no rash, tenderness or lesion on the left labia. Uterus is not deviated, not enlarged, not fixed and not tender. Cervix exhibits no motion tenderness, no discharge and no friability. Right adnexum displays no mass, no tenderness and no fullness. Left adnexum displays no mass, no tenderness and no fullness. No erythema, tenderness or bleeding in the vagina. No foreign body in the vagina. No signs of injury around the vagina. Vaginal  discharge (Thick, white, nonodorous, clinging to the vaginal walls) found.  Musculoskeletal: Normal range of motion. She exhibits no edema.  Lymphadenopathy:       Right: No inguinal adenopathy present.       Left: No inguinal adenopathy present.  Neurological: She is alert.  Skin: Skin is warm and dry. She is not diaphoretic. No erythema.  Psychiatric: She has a normal mood and affect.  Nursing note and vitals reviewed.    ED Treatments / Results  Labs (all labs ordered are listed, but only abnormal results are  displayed) Labs Reviewed  WET PREP, GENITAL - Abnormal; Notable for the following:       Result Value   Yeast Wet Prep HPF POC PRESENT (*)    Clue Cells Wet Prep HPF POC PRESENT (*)    All other components within normal limits  URINALYSIS, ROUTINE W REFLEX MICROSCOPIC (NOT AT [facility]) - Abnormal; Notable for the following:    APPearance CLOUDY (*)    All other components within normal limits  POC URINE PREG, ED  GC/CHLAMYDIA PROBE AMP ([place]) NOT AT [facility]   Procedures Procedures (including critical care time)  Medications Ordered in ED Medications  fluconazole (DIFLUCAN) tablet 150 mg (not administered)     Initial Impression / Assessment and Plan / ED Course  I have reviewed the triage vital signs and the nursing notes.  Pertinent labs & imaging results that were available during my care of the patient were reviewed by me and considered in my medical decision making (see chart for details).  Clinical Course  Value Comment By Time  Temp: 98.7 F (37.1 C) Vital signs stable.   Dahlia ClientHannah Sherie Dobrowolski, PA-C 09/16 0139  Yeast Wet Prep HPF POC: (!) PRESENT Wet prep with yeast and clue cells. Clinically more consistent with yeast vaginitis and with BV. Dahlia ClientHannah Gloriajean Okun, PA-C 09/16 0139  Leukocytes, UA: NEGATIVE No evidence of UTI. Dahlia ClientHannah Cord Wilczynski, PA-C 09/16 0139    Pt With yeast vaginitis. No evidence of BV. Patient treated in the emergency department and given Diflucan for repeat dose in 3 days if symptoms persist. She is to follow with OB/GYN. No evidence of PID.    Final Clinical Impressions(s) / ED Diagnoses   Final diagnoses:  Yeast vaginitis  Concern about STD in female without diagnosis    New Prescriptions New Prescriptions   FLUCONAZOLE (DIFLUCAN) 150 MG TABLET    Take 1 tablet (150 mg total) by mouth once. If symptoms persist after 3 days     Dierdre ForthHannah Aimar Shrewsbury, PA-C 07/27/16 0140    April Palumbo, MD 07/27/16 [phone]

## 2016-07-26 NOTE — ED Notes (Addendum)
Reports she thinks she has a yeast infection. States she first noticed the symptoms about a month ago. Reports she has had this problem before and been here before, states it was around last January. Reports no fever. NAD

## 2016-07-26 NOTE — ED Triage Notes (Signed)
Pt reports vaginal itching and "white vaginal discharge". Denies any abd pain n/v/d.

## 2016-07-27 LAB — WET PREP, GENITAL
Sperm: NONE SEEN
Trich, Wet Prep: NONE SEEN
WBC WET PREP: NONE SEEN

## 2016-07-27 MED ORDER — FLUCONAZOLE 150 MG PO TABS: 150.0000 mg | ORAL_TABLET | Freq: Once | ORAL | 0 refills | Status: AC

## 2016-07-27 MED ORDER — FLUCONAZOLE 100 MG PO TABS
150.0000 mg | ORAL_TABLET | Freq: Once | ORAL | Status: AC
Start: 2016-07-27 — End: 2016-07-27
  Administered 2016-07-27: 150 mg via ORAL
  Filled 2016-07-27: qty 2

## 2016-07-27 NOTE — Discharge Instructions (Signed)
1. Medications: Diflucan - repeated dosage in 3 days if symptoms persist, usual home medications 2. Treatment: rest, drink plenty of fluids,  3. Follow Up: Please followup with your primary doctor or OB/GYN in 3-5 days for discussion of your diagnoses and further evaluation after today's visit; if you do not have a primary care doctor use the resource guide provided to find one; Please return to the ER for worsening symptoms, fevers, abdominal pain or other concerns.

## 2016-07-29 LAB — GC/CHLAMYDIA PROBE AMP (~~LOC~~) NOT AT ARMC
CHLAMYDIA, DNA PROBE: NEGATIVE
Neisseria Gonorrhea: NEGATIVE

## 2016-10-18 ENCOUNTER — Encounter (HOSPITAL_COMMUNITY): Payer: Self-pay | Admitting: *Deleted

## 2016-10-18 ENCOUNTER — Inpatient Hospital Stay (HOSPITAL_COMMUNITY)
Admission: AD | Admit: 2016-10-18 | Discharge: 2016-10-18 | Disposition: A | Discharge status: Home / Self Care | Payer: Medicaid Other | Source: Ambulatory Visit | Attending: Obstetrics & Gynecology | Admitting: Obstetrics & Gynecology

## 2016-10-18 DIAGNOSIS — D649 Anemia, unspecified: Secondary | ICD-10-CM | POA: Insufficient documentation

## 2016-10-18 DIAGNOSIS — Z3A08 8 weeks gestation of pregnancy: Secondary | ICD-10-CM | POA: Diagnosis not present

## 2016-10-18 DIAGNOSIS — O99011 Anemia complicating pregnancy, first trimester: Secondary | ICD-10-CM | POA: Diagnosis not present

## 2016-10-18 DIAGNOSIS — O26811 Pregnancy related exhaustion and fatigue, first trimester: Secondary | ICD-10-CM | POA: Diagnosis present

## 2016-10-18 HISTORY — DX: Other specified health status: Z78.9

## 2016-10-18 LAB — URINALYSIS, ROUTINE W REFLEX MICROSCOPIC
BACTERIA UA: NONE SEEN
BILIRUBIN URINE: NEGATIVE
Glucose, UA: NEGATIVE mg/dL
Hgb urine dipstick: NEGATIVE
KETONES UR: NEGATIVE mg/dL
Nitrite: NEGATIVE
PROTEIN: 30 mg/dL — AB
Specific Gravity, Urine: 1.027 (ref 1.005–1.030)
TRANS EPITHEL UA: 1
pH: 5 (ref 5.0–8.0)

## 2016-10-18 LAB — CBC
HCT: 31.4 % — ABNORMAL LOW (ref 36.0–46.0)
Hemoglobin: 9.5 g/dL — ABNORMAL LOW (ref 12.0–15.0)
MCH: 20.5 pg — AB (ref 26.0–34.0)
MCHC: 30.3 g/dL (ref 30.0–36.0)
MCV: 67.7 fL — ABNORMAL LOW (ref 78.0–100.0)
PLATELETS: 344 10*3/uL (ref 150–400)
RBC: 4.64 MIL/uL (ref 3.87–5.11)
RDW: 18.9 % — ABNORMAL HIGH (ref 11.5–15.5)
WBC: 8.2 10*3/uL (ref 4.0–10.5)

## 2016-10-18 LAB — POCT PREGNANCY, URINE: PREG TEST UR: POSITIVE — AB

## 2016-10-18 NOTE — Progress Notes (Signed)
Heather HornErin Lawrence NP notified of pt's admission and status. Will get lab results and then talk with pt in Triage. Pt aware and agrees. Blood work drawn in Triage and then pt to lobby

## 2016-10-18 NOTE — MAU Provider Note (Signed)
History     CSN: [id number]  Arrival date and time: 10/18/16 1944   First Provider Initiated Contact with Patient 10/18/16 2135      Chief Complaint  Patient presents with  . Fatigue   HPI Heather Whitaker is a 20 y.o. G1P0000 at 452w5d by LMP who presents with fatigue. Symptoms began 1 month ago. Reports no change in symptoms today. Reports feeling "tired all the time" and occasional weakness when at work. Denies chest pain, SOB, abdominal pain, vaginal bleeding, n/v/d, syncope, or insomnia. Has started prenatal care with "some office in [place]". [place] to school in [place] but is here for Winter break; plans on [place] in [place] next appt 1/8.   OB History    Gravida Para Term Preterm AB Living   1 0 0 0 0 0   SAB TAB Ectopic Multiple Live Births   0 0 0 0 0      Past Medical History:  Diagnosis Date  . Medical history non-contributory     Past Surgical History:  Procedure Laterality Date  . MYRINGOTOMY WITH TUBE PLACEMENT      History reviewed. No pertinent family history.  Social History  Substance Use Topics  . Smoking status: Never Smoker  . Smokeless tobacco: Never Used  . Alcohol use Yes    Allergies: No Known Allergies  Prescriptions Prior to Admission  Medication Sig Dispense Refill Last Dose  . Prenatal Vit-Fe Fumarate-FA (PRENATAL MULTIVITAMIN) TABS tablet Take 2 tablets by mouth daily at 12 noon.   10/18/2016 at Unknown time    Review of Systems  Constitutional: Positive for malaise/fatigue. Negative for chills and fever.  Cardiovascular: Negative.   Gastrointestinal: Negative.   Genitourinary: Negative.   Neurological: Positive for weakness. Negative for dizziness and loss of consciousness.   Physical Exam   Blood pressure 117/61, pulse 91, temperature 98.5 F (36.9 C), resp. rate 18, height 5\' 5"  (1.651 m), weight 132 lb 6.4 oz (60.1 kg), last menstrual period 08/18/2016.  Physical Exam  Nursing note and vitals  reviewed. Constitutional: She is oriented to person, place, and time. She appears well-developed and well-nourished. No distress.  HENT:  Head: Normocephalic and atraumatic.  Eyes: Conjunctivae are normal. Right eye exhibits no discharge. Left eye exhibits no discharge. No scleral icterus.  Neck: Normal range of motion.  Cardiovascular: Normal rate, regular rhythm and normal heart sounds.   No murmur heard. Respiratory: Effort normal and breath sounds normal. No respiratory distress. She has no wheezes.  GI: Soft. There is no tenderness.  Neurological: She is alert and oriented to person, place, and time.  Skin: Skin is warm and dry. She is not diaphoretic.  Psychiatric: She has a normal mood and affect. Her behavior is normal. Judgment and thought content normal.    MAU Course  Procedures Results for orders placed or performed during the hospital encounter of 10/18/16 (from the past 24 hour(s))  Urinalysis, Routine w reflex microscopic     Status: Abnormal   Collection Time: 10/18/16  8:15 PM  Result Value Ref Range   Color, Urine YELLOW YELLOW   APPearance CLOUDY (A) CLEAR   Specific Gravity, Urine 1.027 1.005 - 1.030   pH 5.0 5.0 - 8.0   Glucose, UA NEGATIVE NEGATIVE mg/dL   Hgb urine dipstick NEGATIVE NEGATIVE   Bilirubin Urine NEGATIVE NEGATIVE   Ketones, ur NEGATIVE NEGATIVE mg/dL   Protein, ur 30 (A) NEGATIVE mg/dL   Nitrite NEGATIVE NEGATIVE   Leukocytes, UA LARGE (A)  NEGATIVE   RBC / HPF 6-30 0 - 5 RBC/hpf   WBC, UA 6-30 0 - 5 WBC/hpf   Bacteria, UA NONE SEEN NONE SEEN   Squamous Epithelial / LPF 6-30 (A) NONE SEEN   Trans Epithel, UA 1    Mucous PRESENT   CBC     Status: Abnormal   Collection Time: 10/18/16  8:21 PM  Result Value Ref Range   WBC 8.2 4.0 - 10.5 K/uL   RBC 4.64 3.87 - 5.11 MIL/uL   Hemoglobin 9.5 (L) 12.0 - 15.0 g/dL   HCT 56.231.4 (L) 13.036.0 - 86.546.0 %   MCV 67.7 (L) 78.0 - 100.0 fL   MCH 20.5 (L) 26.0 - 34.0 pg   MCHC 30.3 30.0 - 36.0 g/dL   RDW 78.418.9  (H) 69.611.5 - 15.5 %   Platelets 344 150 - 400 K/uL  Pregnancy, urine POC     Status: Abnormal   Collection Time: 10/18/16  8:38 PM  Result Value Ref Range   Preg Test, Ur POSITIVE (A) NEGATIVE    MDM + UPT VSS, NAD CBC -- hemoglobin 9.5  Assessment and Plan  A: 1. Anemia during pregnancy in first trimester    P: Discharge home Start iron supplements -- take colace PRN constipation Discussed purpose of MAU & reasons to return Keep appt with OB  Judeth HornErin Ashraf Mesta 10/18/2016, 9:34 PM

## 2016-10-18 NOTE — MAU Note (Addendum)
I have been feeling weak since finding out I was pregnant. Keep having to sit down. Felt this way since mid Nov. Denies vag bleeding or d/c. No pain. Pt did home upt and was positive

## 2016-10-18 NOTE — Progress Notes (Signed)
Judeth HornErin Lawrence NP in Triage to discuss test results and d/c plan with pt. Written and verbal d/c instructions given and understanding voiced.

## 2016-10-18 NOTE — Progress Notes (Signed)
Pt not in lobby.  

## 2016-10-18 NOTE — Discharge Instructions (Signed)
°  Iron supplement (ferrous sulfate) - take once daily Start stool softener daily if becomes constipated    Pregnancy and Anemia Anemia is a condition in which the concentration of red blood cells or hemoglobin in the blood is below normal. Hemoglobin is a substance in red blood cells that carries oxygen to the tissues of the body. Anemia results in not enough oxygen reaching these tissues. Anemia during pregnancy is common because the fetus uses more iron and folic acid as it is developing. Your body may not produce enough red blood cells because of this. Also, during pregnancy, the liquid part of the blood (plasma) increases by about 50%, and the red blood cells increase by only 25%. This lowers the concentration of the red blood cells and creates a natural anemia-like situation. What are the causes? The most common cause of anemia during pregnancy is not having enough iron in the body to make red blood cells (iron deficiency anemia). Other causes may include:  Folic acid deficiency.  Vitamin B12 deficiency.  Certain prescription or over-the-counter medicines.  Certain medical conditions or infections that destroy red blood cells.  A low platelet count and bleeding caused by antibodies that go through the placenta to the fetus from the mothers blood. What are the signs or symptoms? Mild anemia may not be noticeable. If it becomes severe, symptoms may include:  Tiredness.  Shortness of breath, especially with exercise.  Weakness.  Fainting.  Pale looking skin.  Headaches.  Feeling a fast or irregular heartbeat (palpitations). How is this diagnosed? The type of anemia is usually diagnosed from your family and medical history and blood tests. How is this treated? Treatment of anemia during pregnancy depends on the cause of the anemia. Treatment can include:  Supplements of iron, vitamin B12, or folic acid.  A blood transfusion. This may be needed if blood loss is  severe.  Hospitalization. This may be needed if there is significant continual blood loss.  Dietary changes. Follow these instructions at home:  Follow your dietitian's or health care provider's dietary recommendations.  Increase your vitamin C intake. This will help the stomach absorb more iron.  Eat a diet rich in iron. This would include foods such as:  Liver.  Beef.  Whole grain bread.  Eggs.  Dried fruit.  Take iron and vitamins as directed by your health care provider.  Eat green leafy vegetables. These are a good source of folic acid. Contact a health care provider if:  You have frequent or lasting headaches.  You are looking pale.  You are bruising easily. Get help right away if:  You have extreme weakness, shortness of breath, or chest pain.  You become dizzy or have trouble concentrating.  You have heavy vaginal bleeding.  You develop a rash.  You have bloody or black, tarry stools.  You faint.  You vomit up blood.  You vomit repeatedly.  You have abdominal pain.  You have a fever or persistent symptoms for more than 2-3 days.  You have a fever and your symptoms suddenly get worse.  You are dehydrated. This information is not intended to replace advice given to you by your health care provider. Make sure you discuss any questions you have with your health care provider. Document Released: 10/25/2000 Document Revised: 04/04/2016 Document Reviewed: 06/09/2013 Elsevier Interactive Patient Education  2017 [employer]

## 2016-11-11 NOTE — L&D Delivery Note (Signed)
21 y.o. G1P0000 at 4963w4d delivered a viable female infant in cephalic, LOT position with left compound hand. Loose nuchal cord x1, easily reduced. Right anterior shoulder delivered with ease, left arm delivered in a flexed position. 60 sec delayed cord clamping. Cord clamped x2 and cut. Placenta delivered spontaneously intact, with 3VC. Fundus firm on exam with massage and pitocin. Good hemostasis noted.  Anesthesia: Local anesthesia for repair Laceration: clitoral and 1st degree right of midline Suture: 4-0 Monocryl Good hemostasis noted. EBL: 150cc  Mom and baby recovering in LDR.    Apgars: APGAR (1 MIN): 8   APGAR (5 MINS): 9      Weight: Pending skin to skin  Sponge and instrument count were correct x2. Placenta sent to L&D.  Heather Shirley, DO PGY-1 [facility] Resident 05/15/2017 4:41 PM    OB FELLOW DELIVERY ATTESTATION  I was gloved and present for the delivery in its entirety, and I agree with the above resident's note.    Heather MowElizabeth Arif Amendola, DO OB Fellow 4:44 PM

## 2016-12-08 ENCOUNTER — Encounter (HOSPITAL_COMMUNITY): Payer: Self-pay | Admitting: Emergency Medicine

## 2016-12-08 ENCOUNTER — Emergency Department (HOSPITAL_COMMUNITY)
Admission: EM | Admit: 2016-12-08 | Discharge: 2016-12-08 | Disposition: A | Discharge status: Home / Self Care | Payer: Medicaid Other | Attending: Emergency Medicine | Admitting: Emergency Medicine

## 2016-12-08 DIAGNOSIS — B3731 Acute candidiasis of vulva and vagina: Secondary | ICD-10-CM

## 2016-12-08 DIAGNOSIS — B373 Candidiasis of vulva and vagina: Secondary | ICD-10-CM | POA: Diagnosis not present

## 2016-12-08 DIAGNOSIS — N898 Other specified noninflammatory disorders of vagina: Secondary | ICD-10-CM | POA: Diagnosis present

## 2016-12-08 LAB — URINALYSIS, ROUTINE W REFLEX MICROSCOPIC
BILIRUBIN URINE: NEGATIVE
Glucose, UA: NEGATIVE mg/dL
Hgb urine dipstick: NEGATIVE
KETONES UR: 5 mg/dL — AB
Nitrite: NEGATIVE
PROTEIN: 30 mg/dL — AB
Specific Gravity, Urine: 1.031 — ABNORMAL HIGH (ref 1.005–1.030)
pH: 5 (ref 5.0–8.0)

## 2016-12-08 LAB — WET PREP, GENITAL
Sperm: NONE SEEN
TRICH WET PREP: NONE SEEN

## 2016-12-08 MED ORDER — TERCONAZOLE 0.8 % VA CREA: 1.0000 | TOPICAL_CREAM | Freq: Every day | VAGINAL | 0 refills | Status: DC

## 2016-12-08 NOTE — ED Notes (Signed)
Pelvic exam done by Hope - NP and Cai Anfinson - EMT assisted. 

## 2016-12-08 NOTE — ED Triage Notes (Signed)
Pt here with white vaginal discharge and odor; pt sts is currently [redacted] weeks pregnant

## 2016-12-08 NOTE — ED Notes (Signed)
Pt understood dc material. NAD noted. Scripts given at dc 

## 2016-12-08 NOTE — ED Provider Notes (Signed)
[facility]-EMER[facility]ENCY DEPT Provider Note   CSN: [id number] Arrival date & time: 12/08/16  1824  By signing my name below, I, Ryan Long and Doreatha Martinva Mathews, attest that this documentation has been prepared under the direction and in the presence of [facility]. Damian LeavellNeese, NP Electronically Signed: Garen Lahyan Long and Doreatha MartinEva Mathews, Scribe. 12/08/2016. 9:03 PM.  History   Chief Complaint Chief Complaint  Patient presents with  . Vaginal Discharge   The history is provided by the patient. No language interpreter was used.  Vaginal Discharge   This is a recurrent problem. The current episode started more than 1 week ago. The problem occurs daily. The problem has not changed since onset.The discharge occurs spontaneously. The discharge was white. She is pregnant. Associated symptoms include perineal odor. Pertinent negatives include no abdominal pain, no nausea, no vomiting and no dysuria. She has tried nothing for the symptoms. The treatment provided no relief.    HPI Comments:  Heather Whitaker is a 21 y.o. female G1P0000 5122w0d who presents to the Emergency Department complaining of intermittent white malodorous vaginal discharge onset ~2 months ago. She notes associated symptoms of intermittent vaginal itching. She reports no alleviating factors. She [place] by [place]BGYN in [place], [place]. Pt states OBGYN visit last week where she was told no STD's present. Pt has h/o BV, and reports her current odor and symptoms are not similar. Per pt, estimated date of delivery is July 15th, 2018. No H/o STD.  She reports two years with current sexual partner and denies any new sexual partners. She additionally denies dysuria, hematuria, back pain, nausea, vomiting and abdominal pain.   Past Medical History:  Diagnosis Date  . Medical history non-contributory     There are no active problems to display for this patient.   Past Surgical History:  Procedure Laterality Date  . MYRINGOTOMY WITH TUBE PLACEMENT      OB History    Gravida Para Term Preterm AB Living   1 0 0 0 0 0   SAB TAB Ectopic Multiple Live Births   0 0 0 0 0       Home Medications    Prior to Admission medications   Medication Sig Start Date End Date Taking? Authorizing Provider  Prenatal Vit-Fe Fumarate-FA (PRENATAL MULTIVITAMIN) TABS tablet Take 2 tablets by mouth daily at 12 noon.    Historical Provider, MD  terconazole (TERAZOL 3) 0.8 % vaginal cream Place 1 applicator vaginally at bedtime. 12/08/16   [place] Jakubiak Orlene OchM Rosealynn Mateus, NP    Family History History reviewed. No pertinent family history.  Social History Social History  Substance Use Topics  . Smoking status: Never Smoker  . Smokeless tobacco: Never Used  . Alcohol use Yes     Allergies   Patient has no known allergies.   Review of Systems Review of Systems  HENT: Negative.   Gastrointestinal: Negative for abdominal pain, nausea and vomiting.  Genitourinary: Positive for vaginal discharge. Negative for dysuria, hematuria and vaginal bleeding.       Vaginal itching white d/c.   Musculoskeletal: Negative for back pain.  Skin: Negative for rash.  All other systems reviewed and are negative.    Physical Exam Updated Vital Signs BP 106/63 (BP Location: Right Arm)   Pulse 75   Temp 98.5 F (36.9 C) (Oral)   Resp 16   LMP 08/18/2016   SpO2 100%   Physical Exam  Constitutional: She is oriented to person, place, and time. She appears well-developed and well-nourished.  HENT:  Head: Normocephalic.  Eyes: Conjunctivae are normal.  Cardiovascular: Normal rate.   Pulmonary/Chest: Effort normal.  Abdominal: Soft. She exhibits no distension. There is no tenderness.  Fundal height consistent with dates. Doppler FHT 140  Genitourinary: Vaginal discharge found.  Genitourinary Comments:  External genitalia without lesions. White discharge present in vaginal vault. Cervix long and closed. No CMT. No adnexal tenderness. Uterus appropriate with 16w size. Chaperone present  throughout entire exam.    Musculoskeletal: Normal range of motion.  Neurological: She is alert and oriented to person, place, and time.  Skin: Skin is warm and dry.  Psychiatric: She has a normal mood and affect.  Nursing note and vitals reviewed.    ED Treatments / Results  DIAGNOSTIC STUDIES:  Oxygen Saturation is 100% on RA, normal by my interpretation.    COORDINATION OF CARE:  9:03 PM Discussed treatment plan with pt at bedside which includes wet prep and pt agreed to plan.  Labs (all labs ordered are listed, but only abnormal results are displayed) Labs Reviewed  WET PREP, GENITAL - Abnormal; Notable for the following:       Result Value   Yeast Wet Prep HPF POC PRESENT (*)    Clue Cells Wet Prep HPF POC PRESENT (*)    WBC, Wet Prep HPF POC MANY (*)    All other components within normal limits  URINALYSIS, ROUTINE W REFLEX MICROSCOPIC - Abnormal; Notable for the following:    APPearance HAZY (*)    Specific Gravity, Urine 1.031 (*)    Ketones, ur 5 (*)    Protein, ur 30 (*)    Leukocytes, UA TRACE (*)    Bacteria, UA RARE (*)    Squamous Epithelial / LPF 0-5 (*)    All other components within normal limits  GC/CHLAMYDIA PROBE AMP ([place]) NOT AT [facility]   Radiology No results found.  Procedures Procedures (including critical care time)  Medications Ordered in ED Medications - No data to display   Initial Impression / Assessment and Plan / ED Course  I have reviewed the triage vital signs and the nursing notes.  Pertinent labs & imaging results that were available during my care of the patient were reviewed by me and considered in my medical decision making (see chart for details).     Final Clinical Impressions(s) / ED Diagnoses  21 y.o. G1P0000 @ [redacted]w[redacted]d gestation stable for d/c without abdominal pain, vaginal bleeding or other OB complaints. Will treat for yeast infection and she [facility]  Final diagnoses:  Monilial  vaginitis    New Prescriptions Discharge Medication List as of 12/08/2016  9:58 PM    START taking these medications   Details  terconazole (TERAZOL 3) 0.8 % vaginal cream Place 1 applicator vaginally at bedtime., Starting Sun 12/08/2016, Print      I personally performed the services described in this documentation, which was scribed in my presence. The recorded information has been reviewed and is accurate.     [address] [place], NP 12/09/16 1478    Canary Brim Tegeler, MD 12/09/16 [phone]

## 2016-12-08 NOTE — ED Notes (Signed)
Pt dressed in hospital gown

## 2016-12-08 NOTE — ED Notes (Addendum)
ED Provider at bedside doing pelvic with EMT/tech.

## 2016-12-10 LAB — GC/CHLAMYDIA PROBE AMP (~~LOC~~) NOT AT ARMC
Chlamydia: NEGATIVE
Neisseria Gonorrhea: NEGATIVE

## 2017-03-05 LAB — OB RESULTS CONSOLE ABO/RH: RH TYPE: POSITIVE

## 2017-03-05 LAB — OB RESULTS CONSOLE HGB/HCT, BLOOD
HEMATOCRIT: 37
Hemoglobin: 12.3

## 2017-03-05 LAB — OB RESULTS CONSOLE HIV ANTIBODY (ROUTINE TESTING): HIV: NONREACTIVE

## 2017-03-05 LAB — OB RESULTS CONSOLE RPR: RPR: NONREACTIVE

## 2017-03-05 LAB — OB RESULTS CONSOLE ANTIBODY SCREEN: Antibody Screen: NEGATIVE

## 2017-03-05 LAB — OB RESULTS CONSOLE PLATELET COUNT: Platelets: 194

## 2017-03-05 LAB — OB RESULTS CONSOLE GC/CHLAMYDIA
CHLAMYDIA, DNA PROBE: NEGATIVE
GC PROBE AMP, GENITAL: NEGATIVE

## 2017-03-26 ENCOUNTER — Encounter (HOSPITAL_COMMUNITY): Payer: Self-pay

## 2017-03-26 ENCOUNTER — Inpatient Hospital Stay (HOSPITAL_COMMUNITY)
Admission: AD | Admit: 2017-03-26 | Discharge: 2017-03-26 | Disposition: A | Discharge status: Home / Self Care | Payer: Medicaid Other | Source: Ambulatory Visit | Attending: Obstetrics and Gynecology | Admitting: Obstetrics and Gynecology

## 2017-03-26 DIAGNOSIS — O99891 Other specified diseases and conditions complicating pregnancy: Secondary | ICD-10-CM | POA: Diagnosis present

## 2017-03-26 DIAGNOSIS — M549 Dorsalgia, unspecified: Secondary | ICD-10-CM

## 2017-03-26 DIAGNOSIS — M545 Low back pain: Secondary | ICD-10-CM | POA: Diagnosis present

## 2017-03-26 DIAGNOSIS — O26893 Other specified pregnancy related conditions, third trimester: Secondary | ICD-10-CM

## 2017-03-26 DIAGNOSIS — Z3A31 31 weeks gestation of pregnancy: Secondary | ICD-10-CM | POA: Insufficient documentation

## 2017-03-26 DIAGNOSIS — O479 False labor, unspecified: Secondary | ICD-10-CM | POA: Diagnosis present

## 2017-03-26 DIAGNOSIS — O9989 Other specified diseases and conditions complicating pregnancy, childbirth and the puerperium: Secondary | ICD-10-CM

## 2017-03-26 HISTORY — DX: Other specified diseases and conditions complicating pregnancy, childbirth and the puerperium: O99.89

## 2017-03-26 HISTORY — DX: Other specified diseases and conditions complicating pregnancy: O99.891

## 2017-03-26 HISTORY — DX: Dorsalgia, unspecified: M54.9

## 2017-03-26 HISTORY — DX: Other specified diseases and conditions complicating pregnancy: M54.9

## 2017-03-26 HISTORY — DX: False labor, unspecified: O47.9

## 2017-03-26 LAB — URINALYSIS, ROUTINE W REFLEX MICROSCOPIC
Bilirubin Urine: NEGATIVE
Glucose, UA: NEGATIVE mg/dL
Hgb urine dipstick: NEGATIVE
KETONES UR: NEGATIVE mg/dL
Nitrite: NEGATIVE
PROTEIN: NEGATIVE mg/dL
Specific Gravity, Urine: 1.016 (ref 1.005–1.030)
pH: 7 (ref 5.0–8.0)

## 2017-03-26 LAB — WET PREP, GENITAL
CLUE CELLS WET PREP: NONE SEEN
Sperm: NONE SEEN
TRICH WET PREP: NONE SEEN
YEAST WET PREP: NONE SEEN

## 2017-03-26 NOTE — MAU Provider Note (Signed)
History     CSN: [id number]  Arrival date and time: 03/26/17 1128   First Provider Initiated Contact with Patient 03/26/17 1211      Chief Complaint  Patient presents with  . Back Pain   Heather Whitaker is a 21 yo G1P0 at 31.[redacted] wks gestation presenting with complaints of low back pain and ?contractions x 10 days.  She received PNC in [place], but has moved to [place] and needs to establish [facility]. Denies VB or LOF.  She reports good FM all day.   Back Pain  This is a recurrent problem. The current episode started 1 to 4 weeks ago. The problem occurs intermittently. The problem is unchanged. The pain is present in the sacro-iliac. The quality of the pain is described as aching. The pain does not radiate. The pain is at a severity of 5/10. The pain is mild. The pain is the same all the time. The symptoms are aggravated by position. Stiffness is present all day. Associated symptoms include abdominal pain and pelvic pain (irregular contractions). Risk factors include pregnancy. She has tried nothing for the symptoms.     Past Medical History:  Diagnosis Date  . Medical history non-contributory     Past Surgical History:  Procedure Laterality Date  . MYRINGOTOMY WITH TUBE PLACEMENT      Family History  Problem Relation Age of Onset  . Hypertension Maternal Grandfather   . Cancer Neg Hx   . Diabetes Neg Hx     Social History  Substance Use Topics  . Smoking status: Never Smoker  . Smokeless tobacco: Never Used  . Alcohol use No    Allergies: No Known Allergies  Prescriptions Prior to Admission  Medication Sig Dispense Refill Last Dose  . Prenatal Vit-Fe Fumarate-FA (PRENATAL MULTIVITAMIN) TABS tablet Take 1 tablet by mouth daily at 12 noon.    03/26/2017 at Unknown time    Review of Systems  Constitutional: Negative.  Appetite change: "does not have a good appetite"  HENT: Negative.   Eyes: Negative.   Respiratory: Negative.   Cardiovascular: Negative.    Gastrointestinal: Positive for abdominal pain.  Endocrine: Negative.   Genitourinary: Positive for pelvic pain (irregular contractions).  Musculoskeletal: Positive for back pain.  Skin: Negative.   Allergic/Immunologic: Negative.   Neurological: Negative.   Hematological: Negative.   Psychiatric/Behavioral: Negative.    Physical Exam   Blood pressure 109/65, pulse 90, temperature 98.7 F (37.1 C), temperature source Oral, resp. rate 18, height 5\' 9"  (1.753 m), weight 65.3 kg (144 lb), last menstrual period 08/18/2016, SpO2 100 %.  Physical Exam  Constitutional: She is oriented to person, place, and time. She appears well-developed and well-nourished.  HENT:  Head: Normocephalic.  Eyes: Pupils are equal, round, and reactive to light.  Neck: Normal range of motion.  Cardiovascular: Normal rate, regular rhythm, normal heart sounds and intact distal pulses.   Respiratory: Effort normal and breath sounds normal.  GI: Soft.  Hypoactive BS - ate cereal this morning  Genitourinary:  Genitourinary Comments: Scant amount of white vaginal d/c - wet prep done. Cx: closed/long/firm/posterior No CMT or friability  Musculoskeletal: Normal range of motion.  Neurological: She is alert and oriented to person, place, and time. She has normal reflexes.  Skin: Skin is warm and dry.  Psychiatric: She has a normal mood and affect. Her behavior is normal. Judgment and thought content normal.     CEFM  FHR: 145 bpm / moderate variability / accels present / decels  absent TOCO: irregular UC's  Results for orders placed or performed during the hospital encounter of 03/26/17 (from the past 24 hour(s))  Urinalysis, Routine w reflex microscopic     Status: Abnormal   Collection Time: 03/26/17 11:30 AM  Result Value Ref Range   Color, Urine YELLOW YELLOW   APPearance HAZY (A) CLEAR   Specific Gravity, Urine 1.016 1.005 - 1.030   pH 7.0 5.0 - 8.0   Glucose, UA NEGATIVE NEGATIVE mg/dL   Hgb urine  dipstick NEGATIVE NEGATIVE   Bilirubin Urine NEGATIVE NEGATIVE   Ketones, ur NEGATIVE NEGATIVE mg/dL   Protein, ur NEGATIVE NEGATIVE mg/dL   Nitrite NEGATIVE NEGATIVE   Leukocytes, UA TRACE (A) NEGATIVE   RBC / HPF 0-5 0 - 5 RBC/hpf   WBC, UA 0-5 0 - 5 WBC/hpf   Bacteria, UA RARE (A) NONE SEEN   Squamous Epithelial / LPF 6-30 (A) NONE SEEN   Mucous PRESENT   Wet prep, genital     Status: Abnormal   Collection Time: 03/26/17 12:18 PM  Result Value Ref Range   Yeast Wet Prep HPF POC NONE SEEN NONE SEEN   Trich, Wet Prep NONE SEEN NONE SEEN   Clue Cells Wet Prep HPF POC NONE SEEN NONE SEEN   WBC, Wet Prep HPF POC FEW (A) NONE SEEN   Sperm NONE SEEN    MAU Course  Procedures  MDM CCUA Wet Prep GC/CT NST Pelvic Exam  Assessment and Plan  21 yo G1P0 at 31.[redacted] wks gestation Back pain affecting pregnancy in third trimester  Irregular uterine contractions - discharge home - instructions on back pain in pregnancy - ok to take Tylenol 1000 mg po q 6-8hrs prn pain - [facility] Patient verbalized an understanding of the plan of care and agrees.  Raelyn Moraolitta Sophiarose Eades MSN, CNM 03/26/2017, 12:11 PM

## 2017-03-26 NOTE — MAU Note (Signed)
Pt c/o lower back pain that started a week and a half ago. Pt states the pain comes and goes throughout the day. Pt states baby is moving normally. Pt denies bleeding, contractions, and leaking of fluid. Pt states she had been getting prenatal care in [place] because she was going to school there. Pt states she is back in [place] now and needs to transfer her prenatal care. Pt states she was last seen at the doctor in [place] on 4/25.

## 2017-03-27 LAB — URINE CULTURE
Culture: NO GROWTH
Special Requests: NORMAL

## 2017-03-27 LAB — GC/CHLAMYDIA PROBE AMP (~~LOC~~) NOT AT ARMC
Chlamydia: NEGATIVE
NEISSERIA GONORRHEA: NEGATIVE

## 2017-04-26 ENCOUNTER — Encounter (HOSPITAL_COMMUNITY): Payer: Self-pay | Admitting: Certified Nurse Midwife

## 2017-04-26 ENCOUNTER — Inpatient Hospital Stay (HOSPITAL_COMMUNITY)
Admission: AD | Admit: 2017-04-26 | Discharge: 2017-04-27 | Disposition: A | Discharge status: Home / Self Care | Payer: Medicaid Other | Source: Ambulatory Visit | Attending: Obstetrics and Gynecology | Admitting: Obstetrics and Gynecology

## 2017-04-26 DIAGNOSIS — B373 Candidiasis of vulva and vagina: Secondary | ICD-10-CM | POA: Diagnosis not present

## 2017-04-26 DIAGNOSIS — O98813 Other maternal infectious and parasitic diseases complicating pregnancy, third trimester: Secondary | ICD-10-CM | POA: Insufficient documentation

## 2017-04-26 DIAGNOSIS — O479 False labor, unspecified: Secondary | ICD-10-CM

## 2017-04-26 DIAGNOSIS — O4703 False labor before 37 completed weeks of gestation, third trimester: Secondary | ICD-10-CM | POA: Insufficient documentation

## 2017-04-26 DIAGNOSIS — O26893 Other specified pregnancy related conditions, third trimester: Secondary | ICD-10-CM | POA: Insufficient documentation

## 2017-04-26 DIAGNOSIS — Z809 Family history of malignant neoplasm, unspecified: Secondary | ICD-10-CM | POA: Insufficient documentation

## 2017-04-26 DIAGNOSIS — N760 Acute vaginitis: Secondary | ICD-10-CM

## 2017-04-26 DIAGNOSIS — Z833 Family history of diabetes mellitus: Secondary | ICD-10-CM | POA: Insufficient documentation

## 2017-04-26 DIAGNOSIS — O9989 Other specified diseases and conditions complicating pregnancy, childbirth and the puerperium: Secondary | ICD-10-CM

## 2017-04-26 DIAGNOSIS — M549 Dorsalgia, unspecified: Secondary | ICD-10-CM | POA: Insufficient documentation

## 2017-04-26 DIAGNOSIS — Z9889 Other specified postprocedural states: Secondary | ICD-10-CM | POA: Diagnosis not present

## 2017-04-26 DIAGNOSIS — B9689 Other specified bacterial agents as the cause of diseases classified elsewhere: Secondary | ICD-10-CM | POA: Diagnosis not present

## 2017-04-26 DIAGNOSIS — Z3A36 36 weeks gestation of pregnancy: Secondary | ICD-10-CM | POA: Insufficient documentation

## 2017-04-26 DIAGNOSIS — B3731 Acute candidiasis of vulva and vagina: Secondary | ICD-10-CM

## 2017-04-26 DIAGNOSIS — O99891 Other specified diseases and conditions complicating pregnancy: Secondary | ICD-10-CM

## 2017-04-26 DIAGNOSIS — Z8249 Family history of ischemic heart disease and other diseases of the circulatory system: Secondary | ICD-10-CM | POA: Diagnosis not present

## 2017-04-26 NOTE — MAU Note (Signed)
Patients to MAU with c/o back pain that has been intermittent all day today. Patient denies taking anything today for the back pain, "just waiting to see if it was going to go away". Denies LOF, VB. +FM. Denies contractions or cramping, but "does not know what contractions feel like".

## 2017-04-27 DIAGNOSIS — O479 False labor, unspecified: Secondary | ICD-10-CM

## 2017-04-27 LAB — WET PREP, GENITAL
SPERM: NONE SEEN
TRICH WET PREP: NONE SEEN

## 2017-04-27 LAB — URINALYSIS, ROUTINE W REFLEX MICROSCOPIC
BILIRUBIN URINE: NEGATIVE
GLUCOSE, UA: NEGATIVE mg/dL
HGB URINE DIPSTICK: NEGATIVE
Ketones, ur: NEGATIVE mg/dL
NITRITE: NEGATIVE
PH: 6 (ref 5.0–8.0)
Protein, ur: 30 mg/dL — AB
SPECIFIC GRAVITY, URINE: 1.023 (ref 1.005–1.030)

## 2017-04-27 MED ORDER — TERCONAZOLE 0.4 % VA CREA: 1.0000 | TOPICAL_CREAM | Freq: Every day | VAGINAL | 0 refills | Status: DC

## 2017-04-27 MED ORDER — METRONIDAZOLE 500 MG PO TABS: 500.0000 mg | ORAL_TABLET | Freq: Two times a day (BID) | ORAL | 0 refills | Status: DC

## 2017-04-27 NOTE — MAU Provider Note (Signed)
Chief Complaint:  Back Pain   First Provider Initiated Contact with Patient 04/27/17 0038      HPI: Heather Whitaker is a 21 y.o. G1P0000 at [redacted]w[redacted]d who presents to maternity admissions reporting onset of back pain today.  She reports her pain is intermittent, in her low back, and occurs approximately once per hour. It is intermittent and unchanged since onset. She has not tried any treatments, nothing makes the pain better or worse.  There are no associated symptoms.  She started prenatal care in [place] but has moved to [place] and does not yet have a prenatal provider. She went to the [facility] office and filled out records request form but no appointment is made yet. She reports good fetal movement, denies LOF, vaginal bleeding, vaginal itching/burning, urinary symptoms, h/a, dizziness, n/v, or fever/chills.    HPI  Past Medical History: Past Medical History:  Diagnosis Date  . Back pain affecting pregnancy in third trimester 03/26/2017  . Irregular uterine contractions 03/26/2017  . Medical history non-contributory     Past obstetric history: OB History  Gravida Para Term Preterm AB Living  1 0 0 0 0 0  SAB TAB Ectopic Multiple Live Births  0 0 0 0 0    # Outcome Date GA Lbr Len/2nd Weight Sex Delivery Anes PTL Lv  1 Current               Past Surgical History: Past Surgical History:  Procedure Laterality Date  . MYRINGOTOMY WITH TUBE PLACEMENT      Family History: Family History  Problem Relation Age of Onset  . Hypertension Maternal Grandfather   . Cancer Neg Hx   . Diabetes Neg Hx     Social History: Social History  Substance Use Topics  . Smoking status: Never Smoker  . Smokeless tobacco: Never Used  . Alcohol use No    Allergies: No Known Allergies  Meds:  No prescriptions prior to admission.    ROS:  Review of Systems  Constitutional: Negative for chills, fatigue and fever.  Eyes: Negative for visual disturbance.  Respiratory: Negative for shortness of  breath.   Cardiovascular: Negative for chest pain.  Gastrointestinal: Negative for abdominal pain, nausea and vomiting.  Genitourinary: Negative for difficulty urinating, dysuria, flank pain, pelvic pain, vaginal bleeding, vaginal discharge and vaginal pain.  Musculoskeletal: Positive for back pain.  Neurological: Negative for dizziness and headaches.  Psychiatric/Behavioral: Negative.      I have reviewed patient's Past Medical Hx, Surgical Hx, Family Hx, Social Hx, medications and allergies.   Physical Exam   Patient Vitals for the past 24 hrs:  BP Temp Temp src Pulse Resp Height Weight  04/27/17 0123 (!) 108/58 98.5 F (36.9 C) Oral 74 16 - -  04/26/17 2340 92/79 98.3 F (36.8 C) Oral 97 16 5\' 5"  (1.651 m) 145 lb (65.8 kg)   Constitutional: Well-developed, well-nourished female in no acute distress.  Cardiovascular: normal rate Respiratory: normal effort GI: Abd soft, non-tender, gravid appropriate for gestational age.  MS: Extremities nontender, no edema, normal ROM Neurologic: Alert and oriented x 4.  GU: Neg CVAT.   Dilation: Closed Effacement (%): Thick Cervical Position: Posterior Exam by:: Sharen Counter CNM   Significant frothy vaginal discharge noted so wet prep/GCC collected today  FHT:  Baseline 130 , moderate variability, accelerations present, no decelerations Contractions: None on toco or to palpation   Labs: Results for orders placed or performed during the hospital encounter of 04/26/17 (from the past  24 hour(s))  Urinalysis, Routine w reflex microscopic     Status: Abnormal   Collection Time: 04/26/17 11:29 PM  Result Value Ref Range   Color, Urine YELLOW YELLOW   APPearance HAZY (A) CLEAR   Specific Gravity, Urine 1.023 1.005 - 1.030   pH 6.0 5.0 - 8.0   Glucose, UA NEGATIVE NEGATIVE mg/dL   Hgb urine dipstick NEGATIVE NEGATIVE   Bilirubin Urine NEGATIVE NEGATIVE   Ketones, ur NEGATIVE NEGATIVE mg/dL   Protein, ur 30 (A) NEGATIVE mg/dL    Nitrite NEGATIVE NEGATIVE   Leukocytes, UA MODERATE (A) NEGATIVE   RBC / HPF 0-5 0 - 5 RBC/hpf   WBC, UA 6-30 0 - 5 WBC/hpf   Bacteria, UA RARE (A) NONE SEEN   Squamous Epithelial / LPF 6-30 (A) NONE SEEN   Mucous PRESENT   Wet prep, genital     Status: Abnormal   Collection Time: 04/27/17 12:38 AM  Result Value Ref Range   Yeast Wet Prep HPF POC PRESENT (A) NONE SEEN   Trich, Wet Prep NONE SEEN NONE SEEN   Clue Cells Wet Prep HPF POC PRESENT (A) NONE SEEN   WBC, Wet Prep HPF POC MANY (A) NONE SEEN   Sperm NONE SEEN       Imaging:  No results found.  MAU Course/MDM: I have ordered labs and reviewed results.  NST reviewed and reactive No evidence of active labor today Will treat yeast with Terazol 7 and BV with Flagyl Rest/ice/heat/warm bath/Tylenol for back pain [facility]/transfer Pt stable at time of discharge.   Assessment: 1. Braxton Hicks contractions   2. Back pain affecting pregnancy in third trimester   3. Irregular uterine contractions   4. Bacterial vaginosis   5. Vaginal candidiasis     Plan: Discharge home Labor precautions and fetal kick counts  Follow-up Information    [facility]   Specialty:  Obstetrics and Gynecology Why:  The office will call you with an appointment. Return to MAU with signs of labor or emergencies. Contact information: [address] [place] [id number] [phone]         Allergies as of 04/27/2017   No Known Allergies     Medication List    TAKE these medications   metroNIDAZOLE 500 MG tablet Commonly known as:  FLAGYL Take 1 tablet (500 mg total) by mouth 2 (two) times daily.   prenatal multivitamin Tabs tablet Take 1 tablet by mouth daily at 12 noon.   terconazole 0.4 % vaginal cream Commonly known as:  TERAZOL 7 Place 1 applicator vaginally at bedtime.       Sharen CounterLisa Leftwich-Kirby Certified Nurse-Midwife 04/27/2017 6:48  AM

## 2017-04-28 LAB — GC/CHLAMYDIA PROBE AMP (~~LOC~~) NOT AT ARMC
CHLAMYDIA, DNA PROBE: NEGATIVE
NEISSERIA GONORRHEA: NEGATIVE

## 2017-05-05 ENCOUNTER — Telehealth: Payer: Self-pay | Admitting: Family Medicine

## 2017-05-05 ENCOUNTER — Encounter: Payer: Medicaid Other | Admitting: Certified Nurse Midwife

## 2017-05-05 NOTE — Telephone Encounter (Signed)
Called patient due to missed appointment to begin care. Patient stated that she was unaware of appointment. Rescheduled patient for 05/08/2017. Patient confirmed new appointment.

## 2017-05-07 ENCOUNTER — Encounter: Payer: Self-pay | Admitting: Student

## 2017-05-07 ENCOUNTER — Other Ambulatory Visit (HOSPITAL_COMMUNITY)
Admission: RE | Admit: 2017-05-07 | Discharge: 2017-05-07 | Disposition: A | Discharge status: Home / Self Care | Payer: Medicaid Other | Source: Ambulatory Visit | Attending: Student | Admitting: Student

## 2017-05-07 ENCOUNTER — Ambulatory Visit (INDEPENDENT_AMBULATORY_CARE_PROVIDER_SITE_OTHER): Payer: Medicaid Other | Admitting: Student

## 2017-05-07 DIAGNOSIS — Z113 Encounter for screening for infections with a predominantly sexual mode of transmission: Secondary | ICD-10-CM | POA: Diagnosis not present

## 2017-05-07 DIAGNOSIS — Z3400 Encounter for supervision of normal first pregnancy, unspecified trimester: Secondary | ICD-10-CM

## 2017-05-07 DIAGNOSIS — Z3A37 37 weeks gestation of pregnancy: Secondary | ICD-10-CM | POA: Insufficient documentation

## 2017-05-07 DIAGNOSIS — Z3403 Encounter for supervision of normal first pregnancy, third trimester: Secondary | ICD-10-CM | POA: Diagnosis not present

## 2017-05-07 NOTE — Progress Notes (Signed)
  Subjective:    Heather Whitaker is being seen today for her first obstetrical visit.  This is not a planned pregnancy. She is at 6578w3d gestation. Her obstetrical history is significant for nothing. Relationship with FOB: significant other, not living together. Patient does intend to breast feed. Pregnancy history fully reviewed.  Patient reports no complaints.  Review of Systems:   Review of Systems  HENT: Negative.   Cardiovascular: Negative.   Gastrointestinal: Negative.   Genitourinary: Negative.   Musculoskeletal: Negative.   Neurological: Negative.   Psychiatric/Behavioral: Negative.     Objective:     BP 110/69   Pulse 67   Wt 151 lb 1.6 oz (68.5 kg)   LMP 08/18/2016   BMI 25.14 kg/m  Physical Exam  Constitutional: She is oriented to person, place, and time. She appears well-developed.  HENT:  Head: Normocephalic.  Neck: Normal range of motion.  Respiratory: Effort normal.  GI: Soft.  Neurological: She is alert and oriented to person, place, and time. She has normal reflexes.  Skin: Skin is warm and dry.  Psychiatric: She has a normal mood and affect.    Maternal Exam:  Introitus: Normal vulva. Normal vagina.    Breast exam: normal, no masses or lumps palpated   Assessment:    Pregnancy: G1P0000 Patient Active Problem List   Diagnosis Date Noted  . Supervision of normal first pregnancy, antepartum 05/07/2017  . Back pain affecting pregnancy in third trimester 03/26/2017  . Irregular uterine contractions 03/26/2017       Plan:     Initial labs drawn. Prenatal vitamins. Problem list reviewed and updated. AFP3 discussed: pending transfer of records. Role of ultrasound in pregnancy discussed; fetal survey: waiting for records. Amniocentesis discussed: not indicated. Follow up in 1 weeks. 50% of 30 min visit spent on counseling and coordination of care.   Patient unable to tolerate 1 hour GTT; will instead draw glucose and HbgA1c.  Ob panel collected,  including HIV  GC Ct and GBC collected Nurse and patient will continue trying to get transfer records.   Reviewed warning signs of labor and when to return to the MAU. Patient verbalized understanding.  Charlesetta GaribaldiKathryn Lorraine [facility] 05/07/2017

## 2017-05-07 NOTE — Progress Notes (Signed)
   PRENATAL VISIT NOTE  Subjective:  Heather Whitaker is a 21 y.o. G1P0000 at 2064w3d being seen today for ongoing prenatal care.  She is currently monitored for the following issues for this low-risk pregnancy and has Back pain affecting pregnancy in third trimester; Irregular uterine contractions; and Supervision of normal first pregnancy, antepartum on her problem list.  Patient reports no complaints.  Contractions: Irritability. Vag. Bleeding: None.  Movement: Present. Denies leaking of fluid.   The following portions of the patient's history were reviewed and updated as appropriate: allergies, current medications, past family history, past medical history, past social history, past surgical history and problem list. Problem list updated.  Objective:   Vitals:   05/07/17 0757  BP: 110/69  Pulse: 67  Weight: 151 lb 1.6 oz (68.5 kg)    Fetal Status: Fetal Heart Rate (bpm): 130 Fundal Height: 37 cm Movement: Present     General:  Alert, oriented and cooperative. Patient is in no acute distress.  Skin: Skin is warm and dry. No rash noted.   Cardiovascular: Normal heart rate noted  Respiratory: Normal respiratory effort, no problems with respiration noted  Abdomen: Soft, gravid, appropriate for gestational age. Pain/Pressure: Present     Pelvic:  Cervical exam deferred        Extremities: Normal range of motion.  Edema: None  Mental Status: Normal mood and affect. Normal behavior. Normal judgment and thought content.   Assessment and Plan:  Pregnancy: G1P0000 at 6264w3d  1. Supervision of normal first pregnancy, antepartum Patient unable to tolerate 1 hour GTT (has tried two times); will instead draw HgA1c and blood glucose - Cervicovaginal ancillary only - Strep Gp B NAA - Hemoglobinopathy Evaluation - Obstetric Panel, Including HIV - Glucose  Term labor symptoms and general obstetric precautions including but not limited to vaginal bleeding, contractions, leaking of fluid and fetal  movement were reviewed in detail with the patient. Please refer to After Visit Summary for other counseling recommendations.  Return in about 1 week (around 05/14/2017).   Marylene LandKathryn Lorraine Chazlyn Cude, CNM

## 2017-05-07 NOTE — Patient Instructions (Signed)
How a Baby Grows During Pregnancy Pregnancy begins when a female's sperm enters a female's egg (fertilization). This happens in one of the tubes (fallopian tubes) that connect the ovaries to the womb (uterus). The fertilized egg is called an embryo until it reaches 10 weeks. From 10 weeks until birth, it is called a fetus. The fertilized egg moves down the fallopian tube to the uterus. Then it implants into the lining of the uterus and begins to grow. The developing fetus receives oxygen and nutrients through the pregnant woman's bloodstream and the tissues that grow (placenta) to support the fetus. The placenta is the life support system for the fetus. It provides nutrition and removes waste. Learning as much as you can about your pregnancy and how your baby is developing can help you enjoy the experience. It can also make you aware of when there might be a problem and when to ask questions. How long does a typical pregnancy last? A pregnancy usually lasts 280 days, or about 40 weeks. Pregnancy is divided into three trimesters:  First trimester: 0-13 weeks.  Second trimester: 14-27 weeks.  Third trimester: 28-40 weeks.  The day when your baby is considered ready to be born (full term) is your estimated date of delivery. How does my baby develop month by month? First month  The fertilized egg attaches to the inside of the uterus.  Some cells will form the placenta. Others will form the fetus.  The arms, legs, brain, spinal cord, lungs, and heart begin to develop.  At the end of the first month, the heart begins to beat.  Second month  The bones, inner ear, eyelids, hands, and feet form.  The genitals develop.  By the end of 8 weeks, all major organs are developing.  Third month  All of the internal organs are forming.  Teeth develop below the gums.  Bones and muscles begin to grow. The spine can flex.  The skin is transparent.  Fingernails and toenails begin to form.  Arms  and legs continue to grow longer, and hands and feet develop.  The fetus is about 3 in (7.6 cm) long.  Fourth month  The placenta is completely formed.  The external sex organs, neck, outer ear, eyebrows, eyelids, and fingernails are formed.  The fetus can hear, swallow, and move its arms and legs.  The kidneys begin to produce urine.  The skin is covered with a white waxy coating (vernix) and very fine hair (lanugo).  Fifth month  The fetus moves around more and can be felt for the first time (quickening).  The fetus starts to sleep and wake up and may begin to suck its finger.  The nails grow to the end of the fingers.  The organ in the digestive system that makes bile (gallbladder) functions and helps to digest the nutrients.  If your baby is a girl, eggs are present in her ovaries. If your baby is a boy, testicles start to move down into his scrotum.  Sixth month  The lungs are formed, but the fetus is not yet able to breathe.  The eyes open. The brain continues to develop.  Your baby has fingerprints and toe prints. Your baby's hair grows thicker.  At the end of the second trimester, the fetus is about 9 in (22.9 cm) long.  Seventh month  The fetus kicks and stretches.  The eyes are developed enough to sense changes in light.  The hands can make a grasping motion.  The   fetus responds to sound.  Eighth month  All organs and body systems are fully developed and functioning.  Bones harden and taste buds develop. The fetus may hiccup.  Certain areas of the brain are still developing. The skull remains soft.  Ninth month  The fetus gains about  lb (0.23 kg) each week.  The lungs are fully developed.  Patterns of sleep develop.  The fetus's head typically moves into a head-down position (vertex) in the uterus to prepare for birth. If the buttocks move into a vertex position instead, the baby is breech.  The fetus weighs 6-9 lbs (2.72-4.08 kg) and is  19-20 in (48.26-50.8 cm) long.  What can I do to have a healthy pregnancy and help my baby develop? Eating and Drinking  Eat a healthy diet. ? Talk with your health care provider to make sure that you are getting the nutrients that you and your baby need. ? Visit www.choosemyplate.gov to learn about creating a healthy diet.  Gain a healthy amount of weight during pregnancy as advised by your health care provider. This is usually 25-35 pounds. You may need to: ? Gain more if you were underweight before getting pregnant or if you are pregnant with more than one baby. ? Gain less if you were overweight or obese when you got pregnant.  Medicines and Vitamins  Take prenatal vitamins as directed by your health care provider. These include vitamins such as folic acid, iron, calcium, and vitamin D. They are important for healthy development.  Take medicines only as directed by your health care provider. Read labels and ask a pharmacist or your health care provider whether over-the-counter medicines, supplements, and prescription drugs are safe to take during pregnancy.  Activities  Be physically active as advised by your health care provider. Ask your health care provider to recommend activities that are safe for you to do, such as walking or swimming.  Do not participate in strenuous or extreme sports.  Lifestyle  Do not drink alcohol.  Do not use any tobacco products, including cigarettes, chewing tobacco, or electronic cigarettes. If you need help quitting, ask your health care provider.  Do not use illegal drugs.  Safety  Avoid exposure to mercury, lead, or other heavy metals. Ask your health care provider about common sources of these heavy metals.  Avoid listeria infection during pregnancy. Follow these precautions: ? Do not eat soft cheeses or deli meats. ? Do not eat hot dogs unless they have been warmed up to the point of steaming, such as in the microwave oven. ? Do not  drink unpasteurized milk.  Avoid toxoplasmosis infection during pregnancy. Follow these precautions: ? Do not change your cat's litter box, if you have a cat. Ask someone else to do this for you. ? Wear gardening gloves while working in the yard.  General Instructions  Keep all follow-up visits as directed by your health care provider. This is important. This includes prenatal care and screening tests.  Manage any chronic health conditions. Work closely with your health care provider to keep conditions, such as diabetes, under control.  How do I know if my baby is developing well? At each prenatal visit, your health care provider will do several different tests to check on your health and keep track of your baby's development. These include:  Fundal height. ? Your health care provider will measure your growing belly from top to bottom using a tape measure. ? Your health care provider will also feel your belly   to determine your baby's position.  Heartbeat. ? An ultrasound in the first trimester can confirm pregnancy and show a heartbeat, depending on how far along you are. ? Your health care provider will check your baby's heart rate at every prenatal visit. ? As you get closer to your delivery date, you may have regular fetal heart rate monitoring to make sure that your baby is not in distress.  Second trimester ultrasound. ? This ultrasound checks your baby's development. It also indicates your baby's gender.  What should I do if I have concerns about my baby's development? Always talk with your health care provider about any concerns that you may have. This information is not intended to replace advice given to you by your health care provider. Make sure you discuss any questions you have with your health care provider. Document Released: 04/15/2008 Document Revised: 04/04/2016 Document Reviewed: 04/06/2014 Elsevier Interactive Patient Education  2018 Elsevier Inc.  AREA  PEDIATRIC/FAMILY PRACTICE PHYSICIANS  [facility] [address], [address] [place], [place]  [address] Phone - [phone]   Fax - [phone]  [facility] [address] [place], [place] [address] Phone - [phone]   Fax - [phone]  [facility] [address] [place], [place]  [address] Phone - [phone]   Fax - [phone]  [facility] [address] [place], [place]  [address] Phone - [phone]   Fax - [phone]  [facility] [address] [place], [place]  [address] Phone - [phone]   Fax - [phone]  [facility] [address] [place], [place]  [address] Phone - [phone]   Fax - [phone]  [facility] [address] [place], [place]  [address] Phone - [phone]   Fax - [phone]  [facility] [address] [place], [place]  [address] Phone - [phone]   Fax - [phone]  [facility] [address] [place], [place]  [address] Phone - [phone]   Fax - [phone] [facility] [address] [place], [place]  [address] Phone - [phone]   Fax - [phone]  [facility] [address] [place], [place]  [address] Phone - [phone]   Fax - [phone]  [facility] [address] [place], [place]  [address] Phone - [phone]   Fax - [phone]  [facility] [address] [place], [place]  [address] Phone - [phone]   Fax - [phone]  [facility] [address] [place], [place]  [address] Phone - [phone]  [facility] [address] [place], [place]  [address] Phone - [phone]   Fax - [phone]  [facility] [address] [place], [place]  [address] Phone - [phone]   Fax - [phone]  [facility] [address] [place], [place]  [address] Phone - [phone]   Fax - [phone]   [facility] [address] [place], [place]  [address] Phone - [phone]   Fax - [phone]   [facility] [address] [place], [place]  [address] Phone - [phone]   Fax - [phone]  [facility] [address] [place], [place]  [address] Phone - [phone]   Fax - [phone]  [facility] [address] [place], [place]  [address] Phone - [phone]   Fax - [phone]  [facility] [address] [place], [place]  [address] Phone - [phone]   Fax - [phone]  [facility] [address] [place], [place]  [address] Phone - [phone]   Fax - [phone] [facility] [address] [place], [place]  [address] Phone - [phone]   Fax - [phone]  [facility] [address] [place], [place]  [address] Phone - [phone]   Fax - [phone]  [facility] [address] [place], [place]  [address] Phone - [phone]   Fax - [phone]  [facility] Charlene Flemming MD [address]  [place] [address] Phone [phone]  Fax [phone]   

## 2017-05-08 LAB — HEMOGLOBINOPATHY EVALUATION
Ferritin: 5 ng/mL — ABNORMAL LOW (ref 15–150)
HGB A2 QUANT: 2.3 % (ref 1.8–3.2)
HGB A: 97.7 % (ref 96.4–98.8)
HGB F QUANT: 0 % (ref 0.0–2.0)
HGB S: 0 %
HGB VARIANT: 0 %
Hgb C: 0 %
Hgb Solubility: NEGATIVE

## 2017-05-08 LAB — OBSTETRIC PANEL, INCLUDING HIV
ANTIBODY SCREEN: NEGATIVE
BASOS: 0 %
Basophils Absolute: 0 10*3/uL (ref 0.0–0.2)
EOS (ABSOLUTE): 0.1 10*3/uL (ref 0.0–0.4)
Eos: 1 %
HEMOGLOBIN: 10.8 g/dL — AB (ref 11.1–15.9)
HEP B S AG: NEGATIVE
HIV SCREEN 4TH GENERATION: NONREACTIVE
Hematocrit: 33.3 % — ABNORMAL LOW (ref 34.0–46.6)
Immature Grans (Abs): 0 10*3/uL (ref 0.0–0.1)
Immature Granulocytes: 1 %
LYMPHS ABS: 1.7 10*3/uL (ref 0.7–3.1)
Lymphs: 32 %
MCH: 27.3 pg (ref 26.6–33.0)
MCHC: 32.4 g/dL (ref 31.5–35.7)
MCV: 84 fL (ref 79–97)
MONOS ABS: 0.4 10*3/uL (ref 0.1–0.9)
Monocytes: 7 %
NEUTROS ABS: 3.2 10*3/uL (ref 1.4–7.0)
Neutrophils: 59 %
Platelets: 170 10*3/uL (ref 150–379)
RBC: 3.96 x10E6/uL (ref 3.77–5.28)
RDW: 15.9 % — ABNORMAL HIGH (ref 12.3–15.4)
RH TYPE: POSITIVE
RPR Ser Ql: NONREACTIVE
Rubella Antibodies, IGG: 1.2 index (ref >0.99)
WBC: 5.4 10*3/uL (ref 3.4–10.8)

## 2017-05-08 LAB — CERVICOVAGINAL ANCILLARY ONLY
CHLAMYDIA, DNA PROBE: NEGATIVE
Neisseria Gonorrhea: NEGATIVE

## 2017-05-08 LAB — GLUCOSE, RANDOM: Glucose: 76 mg/dL (ref 65–99)

## 2017-05-09 LAB — STREP GP B NAA: Strep Gp B NAA: POSITIVE — AB

## 2017-05-13 ENCOUNTER — Encounter (HOSPITAL_COMMUNITY): Payer: Self-pay

## 2017-05-13 ENCOUNTER — Inpatient Hospital Stay (HOSPITAL_COMMUNITY)
Admission: AD | Admit: 2017-05-13 | Discharge: 2017-05-13 | Disposition: A | Discharge status: Home / Self Care | Payer: Medicaid Other | Attending: Obstetrics & Gynecology | Admitting: Obstetrics & Gynecology

## 2017-05-13 DIAGNOSIS — O471 False labor at or after 37 completed weeks of gestation: Secondary | ICD-10-CM | POA: Diagnosis not present

## 2017-05-13 DIAGNOSIS — Z3A37 37 weeks gestation of pregnancy: Secondary | ICD-10-CM | POA: Diagnosis not present

## 2017-05-13 NOTE — MAU Note (Signed)
I have communicated with Philipp DeputyKim Shaw CNM and reviewed vital signs:  Vitals:   05/13/17 0219  BP: 108/74  Pulse: 94  Resp: 16  Temp: 98.3 F (36.8 C)    Vaginal exam:  Dilation: Fingertip Effacement (%): Thick Cervical Position: Posterior Exam by:: Camelia Enganielle Mayela Bullard RN,   Also reviewed contraction pattern and that non-stress test is reactive.  It has been documented that patient is contracting occasionally with no cervical change from previous exam not indicating active labor.  Patient denies any other complaints.  Based on this report provider has given order for discharge.  A discharge order and diagnosis entered by a provider.   Labor discharge instructions reviewed with patient.

## 2017-05-13 NOTE — MAU Note (Signed)
Pt reports lower back pain and abdominal tightening that started tonight around 1240am. Pt states she has not been timing the contractions. Pt denies LOF or vaginal bleeding. Reports good fetal movement. States cervix has not been examined.

## 2017-05-13 NOTE — Discharge Instructions (Signed)
Braxton Hicks Contractions °Contractions of the uterus can occur throughout pregnancy, but they are not always a sign that you are in labor. You may have practice contractions called Braxton Hicks contractions. These false labor contractions are sometimes confused with true labor. °What are Braxton Hicks contractions? °Braxton Hicks contractions are tightening movements that occur in the muscles of the uterus before labor. Unlike true labor contractions, these contractions do not result in opening (dilation) and thinning of the cervix. Toward the end of pregnancy (32-34 weeks), Braxton Hicks contractions can happen more often and may become stronger. These contractions are sometimes difficult to tell apart from true labor because they can be very uncomfortable. You should not feel embarrassed if you go to the hospital with false labor. °Sometimes, the only way to tell if you are in true labor is for your health care provider to look for changes in the cervix. The health care provider will do a physical exam and may monitor your contractions. If you are not in true labor, the exam should show that your cervix is not dilating and your water has not broken. °If there are no prenatal problems or other health problems associated with your pregnancy, it is completely safe for you to be sent home with false labor. You may continue to have Braxton Hicks contractions until you go into true labor. °How can I tell the difference between true labor and false labor? °· Differences °? False labor °? Contractions last 30-70 seconds.: Contractions are usually shorter and not as strong as true labor contractions. °? Contractions become very regular.: Contractions are usually irregular. °? Discomfort is usually felt in the top of the uterus, and it spreads to the lower abdomen and low back.: Contractions are often felt in the front of the lower abdomen and in the groin. °? Contractions do not go away with walking.: Contractions may  go away when you walk around or change positions while lying down. °? Contractions usually become more intense and increase in frequency.: Contractions get weaker and are shorter-lasting as time goes on. °? The cervix dilates and gets thinner.: The cervix usually does not dilate or become thin. °Follow these instructions at home: °· Take over-the-counter and prescription medicines only as told by your health care provider. °· Keep up with your usual exercises and follow other instructions from your health care provider. °· Eat and drink lightly if you think you are going into labor. °· If Braxton Hicks contractions are making you uncomfortable: °? Change your position from lying down or resting to walking, or change from walking to resting. °? Sit and rest in a tub of warm water. °? Drink enough fluid to keep your urine clear or pale yellow. Dehydration may cause these contractions. °? Do slow and deep breathing several times an hour. °· Keep all follow-up prenatal visits as told by your health care provider. This is important. °Contact a health care provider if: °· You have a fever. °· You have continuous pain in your abdomen. °Get help right away if: °· Your contractions become stronger, more regular, and closer together. °· You have fluid leaking or gushing from your vagina. °· You pass blood-tinged mucus (bloody show). °· You have bleeding from your vagina. °· You have low back pain that you never had before. °· You feel your baby’s head pushing down and causing pelvic pressure. °· Your baby is not moving inside you as much as it used to. °Summary °· Contractions that occur before labor are   called Braxton Hicks contractions, false labor, or practice contractions. °· Braxton Hicks contractions are usually shorter, weaker, farther apart, and less regular than true labor contractions. True labor contractions usually become progressively stronger and regular and they become more frequent. °· Manage discomfort from  Braxton Hicks contractions by changing position, resting in a warm bath, drinking plenty of water, or practicing deep breathing. °This information is not intended to replace advice given to you by your health care provider. Make sure you discuss any questions you have with your health care provider. °Document Released: 10/28/2005 Document Revised: 09/16/2016 Document Reviewed: 09/16/2016 °Elsevier Interactive Patient Education © 2017 Elsevier Inc. ° ° °Fetal Movement Counts °Patient Name: ________________________________________________ Patient Due Date: ____________________ °What is a fetal movement count? °A fetal movement count is the number of times that you feel your baby move during a certain amount of time. This may also be called a fetal kick count. A fetal movement count is recommended for every pregnant woman. You may be asked to start counting fetal movements as early as week 28 of your pregnancy. °Pay attention to when your baby is most active. You may notice your baby's sleep and wake cycles. You may also notice things that make your baby move more. You should do a fetal movement count: °· When your baby is normally most active. °· At the same time each day. ° °A good time to count movements is while you are resting, after having something to eat and drink. °How do I count fetal movements? °1. Find a quiet, comfortable area. Sit, or lie down on your side. °2. Write down the date, the start time and stop time, and the number of movements that you felt between those two times. Take this information with you to your health care visits. °3. For 2 hours, count kicks, flutters, swishes, rolls, and jabs. You should feel at least 10 movements during 2 hours. °4. You may stop counting after you have felt 10 movements. °5. If you do not feel 10 movements in 2 hours, have something to eat and drink. Then, keep resting and counting for 1 hour. If you feel at least 4 movements during that hour, you may stop  counting. °Contact a health care provider if: °· You feel fewer than 4 movements in 2 hours. °· Your baby is not moving like he or she usually does. °Date: ____________ Start time: ____________ Stop time: ____________ Movements: ____________ °Date: ____________ Start time: ____________ Stop time: ____________ Movements: ____________ °Date: ____________ Start time: ____________ Stop time: ____________ Movements: ____________ °Date: ____________ Start time: ____________ Stop time: ____________ Movements: ____________ °Date: ____________ Start time: ____________ Stop time: ____________ Movements: ____________ °Date: ____________ Start time: ____________ Stop time: ____________ Movements: ____________ °Date: ____________ Start time: ____________ Stop time: ____________ Movements: ____________ °Date: ____________ Start time: ____________ Stop time: ____________ Movements: ____________ °Date: ____________ Start time: ____________ Stop time: ____________ Movements: ____________ °This information is not intended to replace advice given to you by your health care provider. Make sure you discuss any questions you have with your health care provider. °Document Released: 11/27/2006 Document Revised: 06/26/2016 Document Reviewed: 12/07/2015 °Elsevier Interactive Patient Education © 2018 Elsevier Inc. ° °

## 2017-05-14 ENCOUNTER — Encounter (HOSPITAL_COMMUNITY): Payer: Self-pay | Admitting: *Deleted

## 2017-05-14 ENCOUNTER — Inpatient Hospital Stay (EMERGENCY_DEPARTMENT_HOSPITAL)
Admission: AD | Admit: 2017-05-14 | Discharge: 2017-05-14 | Disposition: A | Discharge status: Home / Self Care | Payer: Medicaid Other | Source: Ambulatory Visit | Attending: Family Medicine | Admitting: Family Medicine

## 2017-05-14 DIAGNOSIS — M549 Dorsalgia, unspecified: Secondary | ICD-10-CM

## 2017-05-14 DIAGNOSIS — O479 False labor, unspecified: Secondary | ICD-10-CM

## 2017-05-14 DIAGNOSIS — O9989 Other specified diseases and conditions complicating pregnancy, childbirth and the puerperium: Principal | ICD-10-CM

## 2017-05-14 DIAGNOSIS — O26893 Other specified pregnancy related conditions, third trimester: Secondary | ICD-10-CM

## 2017-05-14 DIAGNOSIS — O99891 Other specified diseases and conditions complicating pregnancy: Secondary | ICD-10-CM

## 2017-05-14 LAB — URINALYSIS, ROUTINE W REFLEX MICROSCOPIC
BACTERIA UA: NONE SEEN
BILIRUBIN URINE: NEGATIVE
Glucose, UA: NEGATIVE mg/dL
Hgb urine dipstick: NEGATIVE
KETONES UR: 20 mg/dL — AB
Nitrite: NEGATIVE
Protein, ur: NEGATIVE mg/dL
Specific Gravity, Urine: 1.012 (ref 1.005–1.030)
pH: 6 (ref 5.0–8.0)

## 2017-05-14 MED ORDER — CYCLOBENZAPRINE HCL 5 MG PO TABS: 5.0000 mg | ORAL_TABLET | Freq: Three times a day (TID) | ORAL | 0 refills | Status: DC | PRN

## 2017-05-14 NOTE — Progress Notes (Signed)
Wynelle BourgeoisMarie Williams CNM in earlier to see pt and discuss d/c plan. Written and verbal d/c instructions given and understanding voiced.

## 2017-05-14 NOTE — MAU Note (Signed)
Been having like real bad back pain, was seen for it yesterday early in the morning, was told if it came back to return.  It has come back today.

## 2017-05-14 NOTE — MAU Provider Note (Signed)
Chief Complaint:  Back Pain   First Provider Initiated Contact with Patient 05/14/17 1455     HPI: Heather Whitaker is a 21 y.o. G1P0000 at 56w3dwho presents to maternity admissions reporting low back pain since yesterday.  States has some painful uterine contractions.  No history of injury.  . She reports good fetal movement, denies LOF, vaginal bleeding, vaginal itching/burning, urinary symptoms, h/a, dizziness, n/v, diarrhea, constipation or fever/chills.    Back Pain  This is a new problem. The current episode started yesterday. The problem occurs constantly. The problem is unchanged. The pain is present in the lumbar spine. The pain does not radiate. The pain is moderate. The pain is the same all the time. The symptoms are aggravated by bending, twisting, lying down, sitting and standing. Associated symptoms include abdominal pain. Pertinent negatives include no dysuria, fever, headaches, numbness, paresis, paresthesias, tingling or weakness. She has tried nothing for the symptoms.    RN Note: Been having like real bad back pain, was seen for it yesterday early in the morning, was told if it came back to return.  It has come back today.  Past Medical History: Past Medical History:  Diagnosis Date  . Back pain affecting pregnancy in third trimester 03/26/2017  . Irregular uterine contractions 03/26/2017  . Medical history non-contributory     Past obstetric history: OB History  Gravida Para Term Preterm AB Living  1 0 0 0 0 0  SAB TAB Ectopic Multiple Live Births  0 0 0 0 0    # Outcome Date GA Lbr Len/2nd Weight Sex Delivery Anes PTL Lv  1 Current               Past Surgical History: Past Surgical History:  Procedure Laterality Date  . MYRINGOTOMY WITH TUBE PLACEMENT      Family History: Family History  Problem Relation Age of Onset  . Hypertension Maternal Grandfather   . Asthma Brother   . Cancer Neg Hx   . Diabetes Neg Hx     Social History: Social History   Substance Use Topics  . Smoking status: Never Smoker  . Smokeless tobacco: Never Used  . Alcohol use No    Allergies: No Known Allergies  Meds:  Prescriptions Prior to Admission  Medication Sig Dispense Refill Last Dose  . Prenatal Vit-Fe Fumarate-FA (PRENATAL MULTIVITAMIN) TABS tablet Take 1 tablet by mouth daily at 12 noon.    05/14/2017 at Unknown time    I have reviewed patient's Past Medical Hx, Surgical Hx, Family Hx, Social Hx, medications and allergies.   ROS:  Review of Systems  Constitutional: Negative for fever.  Gastrointestinal: Positive for abdominal pain.  Genitourinary: Negative for dysuria.  Musculoskeletal: Positive for back pain.  Neurological: Negative for tingling, weakness, numbness, headaches and paresthesias.   Other systems negative  Physical Exam  Patient Vitals for the past 24 hrs:  BP Temp Temp src Pulse Resp SpO2 Weight  05/14/17 1429 109/68 97.8 F (36.6 C) Oral 91 16 100 % 151 lb 8 oz (68.7 kg)   Constitutional: Well-developed, well-nourished female in no acute distress.  Cardiovascular: normal rate and rhythm Respiratory: normal effort, clear to auscultation bilaterally GI: Abd soft, non-tender, gravid appropriate for gestational age.   No rebound or guarding. MS: Extremities nontender, no edema, normal ROM    Negative straight leg raises bilaterally        Lumbar paraspinous muscles mildly tender Neurologic: Alert and oriented x 4.  GU: Neg CVAT.  PELVIC EXAM:   Dilation: Fingertip Effacement (%): 60, 70 Cervical Position: Middle, Anterior Station: -3 Presentation: Vertex Exam by:: Mayford KnifeWilliams CNM   FHT:  Baseline 125-130 , moderate variability, accelerations present, no decelerations Contractions:  Irregular     Labs: O/Positive/-- (06/27 0846) Results for orders placed or performed during the hospital encounter of 05/14/17 (from the past 24 hour(s))  Urinalysis, Routine w reflex microscopic     Status: Abnormal   Collection Time:  05/14/17  2:20 PM  Result Value Ref Range   Color, Urine YELLOW YELLOW   APPearance CLEAR CLEAR   Specific Gravity, Urine 1.012 1.005 - 1.030   pH 6.0 5.0 - 8.0   Glucose, UA NEGATIVE NEGATIVE mg/dL   Hgb urine dipstick NEGATIVE NEGATIVE   Bilirubin Urine NEGATIVE NEGATIVE   Ketones, ur 20 (A) NEGATIVE mg/dL   Protein, ur NEGATIVE NEGATIVE mg/dL   Nitrite NEGATIVE NEGATIVE   Leukocytes, UA TRACE (A) NEGATIVE   RBC / HPF 0-5 0 - 5 RBC/hpf   WBC, UA 0-5 0 - 5 WBC/hpf   Bacteria, UA NONE SEEN NONE SEEN   Squamous Epithelial / LPF 6-30 (A) NONE SEEN   Mucous PRESENT     Imaging:  No results found.  MAU Course/MDM: I have ordered labs and reviewed results. No strong evidence of UTI NST reviewed and found to be reactive, Category I Not in labor Probable muscle spasm in lumbar paraspinous muscles, will try Flexeril  Assessment: 1. Back pain affecting pregnancy in third trimester   2. Irregular uterine contractions   3.     SIUP at 6030w3d   Plan: Discharge home Rx flexeril 5mg  po q8 hrs prn spasm Labor precautions and fetal kick counts Follow up in Office for prenatal visits and recheck of cervix   Encouraged to return here or to other Urgent Care/ED if she develops worsening of symptoms, increase in pain, fever, or other concerning symptoms.   Pt stable at time of discharge.  Wynelle BourgeoisMarie Williams CNM, MSN Certified Nurse-Midwife 05/14/2017 2:56 PM

## 2017-05-14 NOTE — Progress Notes (Signed)
Pt requested to lie on R side for comfort

## 2017-05-14 NOTE — Progress Notes (Signed)
Pt moved to L tilt

## 2017-05-14 NOTE — Discharge Instructions (Signed)

## 2017-05-15 ENCOUNTER — Encounter (HOSPITAL_COMMUNITY): Payer: Self-pay

## 2017-05-15 ENCOUNTER — Encounter: Payer: Medicaid Other | Admitting: Advanced Practice Midwife

## 2017-05-15 ENCOUNTER — Inpatient Hospital Stay (HOSPITAL_COMMUNITY)
Admission: AD | Admit: 2017-05-15 | Discharge: 2017-05-15 | Disposition: A | Discharge status: Home / Self Care | Payer: Medicaid Other | Source: Ambulatory Visit | Attending: Obstetrics & Gynecology | Admitting: Obstetrics & Gynecology

## 2017-05-15 ENCOUNTER — Encounter (HOSPITAL_COMMUNITY): Payer: Self-pay | Admitting: *Deleted

## 2017-05-15 ENCOUNTER — Inpatient Hospital Stay (HOSPITAL_COMMUNITY)
Admission: AD | Admit: 2017-05-15 | Discharge: 2017-05-17 | DRG: 775 | Disposition: A | Discharge status: Home / Self Care | Payer: Medicaid Other | Source: Ambulatory Visit | Attending: Obstetrics and Gynecology | Admitting: Obstetrics and Gynecology

## 2017-05-15 ENCOUNTER — Inpatient Hospital Stay (HOSPITAL_COMMUNITY)
Admission: AD | Admit: 2017-05-15 | Discharge: 2017-05-15 | Disposition: A | Discharge status: Home / Self Care | Payer: Medicaid Other | Source: Ambulatory Visit | Attending: Obstetrics and Gynecology | Admitting: Obstetrics and Gynecology

## 2017-05-15 DIAGNOSIS — Z3A38 38 weeks gestation of pregnancy: Secondary | ICD-10-CM

## 2017-05-15 DIAGNOSIS — O479 False labor, unspecified: Secondary | ICD-10-CM

## 2017-05-15 DIAGNOSIS — O99824 Streptococcus B carrier state complicating childbirth: Secondary | ICD-10-CM | POA: Diagnosis present

## 2017-05-15 DIAGNOSIS — M549 Dorsalgia, unspecified: Secondary | ICD-10-CM | POA: Diagnosis not present

## 2017-05-15 DIAGNOSIS — O26893 Other specified pregnancy related conditions, third trimester: Secondary | ICD-10-CM | POA: Diagnosis not present

## 2017-05-15 DIAGNOSIS — Z3493 Encounter for supervision of normal pregnancy, unspecified, third trimester: Secondary | ICD-10-CM | POA: Diagnosis present

## 2017-05-15 LAB — CBC
HCT: 37.7 % (ref 36.0–46.0)
Hemoglobin: 12.2 g/dL (ref 12.0–15.0)
MCH: 28 pg (ref 26.0–34.0)
MCHC: 32.4 g/dL (ref 30.0–36.0)
MCV: 86.5 fL (ref 78.0–100.0)
PLATELETS: 177 10*3/uL (ref 150–400)
RBC: 4.36 MIL/uL (ref 3.87–5.11)
RDW: 15.5 % (ref 11.5–15.5)
WBC: 11.9 10*3/uL — AB (ref 4.0–10.5)

## 2017-05-15 LAB — ABO/RH: ABO/RH(D): O POS

## 2017-05-15 LAB — TYPE AND SCREEN
ABO/RH(D): O POS
Antibody Screen: NEGATIVE

## 2017-05-15 MED ORDER — SODIUM CHLORIDE 0.9% FLUSH: 3.0000 mL | INTRAVENOUS | Status: DC | PRN

## 2017-05-15 MED ORDER — FENTANYL CITRATE (PF) 100 MCG/2ML IJ SOLN
50.0000 ug | Freq: Once | INTRAMUSCULAR | Status: AC
  Administered 2017-05-15: 50 ug via INTRAMUSCULAR

## 2017-05-15 MED ORDER — DIPHENHYDRAMINE HCL 25 MG PO CAPS: 25.0000 mg | ORAL_CAPSULE | Freq: Four times a day (QID) | ORAL | Status: DC | PRN

## 2017-05-15 MED ORDER — SODIUM CHLORIDE 0.9 % IV SOLN
2.0000 g | Freq: Once | INTRAVENOUS | Status: AC
  Administered 2017-05-15: 2 g via INTRAVENOUS
  Filled 2017-05-15: qty 2000

## 2017-05-15 MED ORDER — SODIUM CHLORIDE 0.9 % IV SOLN: 250.0000 mL | INTRAVENOUS | Status: DC | PRN

## 2017-05-15 MED ORDER — ACETAMINOPHEN 325 MG PO TABS
650.0000 mg | ORAL_TABLET | ORAL | Status: DC | PRN
  Administered 2017-05-15: 650 mg via ORAL
  Filled 2017-05-15: qty 2

## 2017-05-15 MED ORDER — OXYTOCIN 40 UNITS IN LACTATED RINGERS INFUSION - SIMPLE MED: 2.5000 [IU]/h | INTRAVENOUS | Status: DC | PRN

## 2017-05-15 MED ORDER — LACTATED RINGERS IV SOLN: 500.0000 mL | Freq: Once | INTRAVENOUS | Status: DC

## 2017-05-15 MED ORDER — OXYCODONE-ACETAMINOPHEN 5-325 MG PO TABS: 2.0000 | ORAL_TABLET | ORAL | Status: DC | PRN

## 2017-05-15 MED ORDER — FENTANYL 2.5 MCG/ML BUPIVACAINE 1/10 % EPIDURAL INFUSION (WH - ANES): 14.0000 mL/h | INTRAMUSCULAR | Status: DC | PRN

## 2017-05-15 MED ORDER — PHENYLEPHRINE 40 MCG/ML (10ML) SYRINGE FOR IV PUSH (FOR BLOOD PRESSURE SUPPORT)
80.0000 ug | PREFILLED_SYRINGE | INTRAVENOUS | Status: DC | PRN
  Filled 2017-05-15: qty 5

## 2017-05-15 MED ORDER — DIPHENHYDRAMINE HCL 50 MG/ML IJ SOLN: 12.5000 mg | INTRAMUSCULAR | Status: DC | PRN

## 2017-05-15 MED ORDER — LACTATED RINGERS IV SOLN: 500.0000 mL | INTRAVENOUS | Status: DC | PRN

## 2017-05-15 MED ORDER — FLEET ENEMA 7-19 GM/118ML RE ENEM: 1.0000 | ENEMA | RECTAL | Status: DC | PRN

## 2017-05-15 MED ORDER — SENNOSIDES-DOCUSATE SODIUM 8.6-50 MG PO TABS
2.0000 | ORAL_TABLET | ORAL | Status: DC
  Administered 2017-05-15 – 2017-05-17 (×2): 2 via ORAL
  Filled 2017-05-15 (×2): qty 2

## 2017-05-15 MED ORDER — EPHEDRINE 5 MG/ML INJ
10.0000 mg | INTRAVENOUS | Status: DC | PRN
  Filled 2017-05-15: qty 2

## 2017-05-15 MED ORDER — TETANUS-DIPHTH-ACELL PERTUSSIS 5-2.5-18.5 LF-MCG/0.5 IM SUSP: 0.5000 mL | Freq: Once | INTRAMUSCULAR | Status: DC

## 2017-05-15 MED ORDER — WITCH HAZEL-GLYCERIN EX PADS: 1.0000 "application " | MEDICATED_PAD | CUTANEOUS | Status: DC | PRN

## 2017-05-15 MED ORDER — LACTATED RINGERS IV SOLN
INTRAVENOUS | Status: DC
  Administered 2017-05-15: 125 mL via INTRAVENOUS

## 2017-05-15 MED ORDER — SOD CITRATE-CITRIC ACID 500-334 MG/5ML PO SOLN: 30.0000 mL | ORAL | Status: DC | PRN

## 2017-05-15 MED ORDER — FENTANYL CITRATE (PF) 100 MCG/2ML IJ SOLN: 100.0000 ug | INTRAMUSCULAR | Status: DC | PRN

## 2017-05-15 MED ORDER — DIBUCAINE 1 % RE OINT: 1.0000 "application " | TOPICAL_OINTMENT | RECTAL | Status: DC | PRN

## 2017-05-15 MED ORDER — COCONUT OIL OIL: 1.0000 "application " | TOPICAL_OIL | Status: DC | PRN

## 2017-05-15 MED ORDER — FENTANYL CITRATE (PF) 2500 MCG/50ML IJ SOLN: 100.0000 ug/h | Freq: Once | INTRAMUSCULAR | Status: DC

## 2017-05-15 MED ORDER — FENTANYL CITRATE (PF) 100 MCG/2ML IJ SOLN
100.0000 ug | Freq: Once | INTRAMUSCULAR | Status: DC
  Filled 2017-05-15: qty 2

## 2017-05-15 MED ORDER — ONDANSETRON HCL 4 MG PO TABS: 4.0000 mg | ORAL_TABLET | ORAL | Status: DC | PRN

## 2017-05-15 MED ORDER — LIDOCAINE HCL (PF) 1 % IJ SOLN
30.0000 mL | INTRAMUSCULAR | Status: DC | PRN
  Administered 2017-05-15: 30 mL via SUBCUTANEOUS
  Filled 2017-05-15: qty 30

## 2017-05-15 MED ORDER — SIMETHICONE 80 MG PO CHEW: 80.0000 mg | CHEWABLE_TABLET | ORAL | Status: DC | PRN

## 2017-05-15 MED ORDER — OXYTOCIN BOLUS FROM INFUSION
500.0000 mL | Freq: Once | INTRAVENOUS | Status: AC
  Administered 2017-05-15: 500 mL via INTRAVENOUS

## 2017-05-15 MED ORDER — ZOLPIDEM TARTRATE 5 MG PO TABS: 5.0000 mg | ORAL_TABLET | Freq: Every evening | ORAL | Status: DC | PRN

## 2017-05-15 MED ORDER — ACETAMINOPHEN 325 MG PO TABS: 650.0000 mg | ORAL_TABLET | ORAL | Status: DC | PRN

## 2017-05-15 MED ORDER — ONDANSETRON HCL 4 MG/2ML IJ SOLN: 4.0000 mg | Freq: Four times a day (QID) | INTRAMUSCULAR | Status: DC | PRN

## 2017-05-15 MED ORDER — PRENATAL MULTIVITAMIN CH
1.0000 | ORAL_TABLET | Freq: Every day | ORAL | Status: DC
  Administered 2017-05-16: 1 via ORAL
  Filled 2017-05-15: qty 1

## 2017-05-15 MED ORDER — SODIUM CHLORIDE 0.9% FLUSH: 3.0000 mL | Freq: Two times a day (BID) | INTRAVENOUS | Status: DC

## 2017-05-15 MED ORDER — BENZOCAINE-MENTHOL 20-0.5 % EX AERO
1.0000 "application " | INHALATION_SPRAY | CUTANEOUS | Status: DC | PRN
  Filled 2017-05-15: qty 56

## 2017-05-15 MED ORDER — OXYTOCIN 40 UNITS IN LACTATED RINGERS INFUSION - SIMPLE MED
2.5000 [IU]/h | INTRAVENOUS | Status: DC
  Administered 2017-05-15: 2.5 [IU]/h via INTRAVENOUS
  Filled 2017-05-15: qty 1000

## 2017-05-15 MED ORDER — ONDANSETRON HCL 4 MG/2ML IJ SOLN: 4.0000 mg | INTRAMUSCULAR | Status: DC | PRN

## 2017-05-15 MED ORDER — OXYCODONE-ACETAMINOPHEN 5-325 MG PO TABS: 1.0000 | ORAL_TABLET | ORAL | Status: DC | PRN

## 2017-05-15 MED ORDER — IBUPROFEN 600 MG PO TABS
600.0000 mg | ORAL_TABLET | Freq: Four times a day (QID) | ORAL | Status: DC
  Administered 2017-05-15 – 2017-05-17 (×7): 600 mg via ORAL
  Filled 2017-05-15 (×6): qty 1

## 2017-05-15 NOTE — MAU Note (Signed)
At discharge pt's visitor asks if pt is ruptured and rn states pt has not mentioned leaking fluid. Visitor talks to pt about didn't she think she was and pt states no, I just want to go home. Discharged to private auto per w/c due to resent dose of pain meds.

## 2017-05-15 NOTE — H&P (Signed)
LABOR AND DELIVERY ADMISSION HISTORY AND PHYSICAL NOTE  Heather Whitaker is a 21 y.o. female G1P0000 with IUP at [redacted]w[redacted]d by LMP c/w 6 wk [place] presenting for SOL.  Patient arrived in active, advanced labor. She reports positive fetal movement. She denies leakage of fluid or vaginal bleeding.  Prenatal History/Complications:  NONE  Past Medical History: Past Medical History:  Diagnosis Date  . Back pain affecting pregnancy in third trimester 03/26/2017  . Irregular uterine contractions 03/26/2017  . Medical history non-contributory     Past Surgical History: Past Surgical History:  Procedure Laterality Date  . MYRINGOTOMY WITH TUBE PLACEMENT      Obstetrical History: OB History    Gravida Para Term Preterm AB Living   1 0 0 0 0 0   SAB TAB Ectopic Multiple Live Births   0 0 0 0 0      Social History: Social History   Social History  . Marital status: Single    Spouse name: N/A  . Number of children: N/A  . Years of education: N/A   Social History Main Topics  . Smoking status: Never Smoker  . Smokeless tobacco: Never Used  . Alcohol use No  . Drug use: No  . Sexual activity: No   Other Topics Concern  . None   Social History Narrative  . None    Family History: Family History  Problem Relation Age of Onset  . Hypertension Maternal Grandfather   . Asthma Brother   . Cancer Neg Hx   . Diabetes Neg Hx     Allergies: No Known Allergies  Prescriptions Prior to Admission  Medication Sig Dispense Refill Last Dose  . Prenatal Vit-Fe Fumarate-FA (PRENATAL MULTIVITAMIN) TABS tablet Take 1 tablet by mouth daily at 12 noon.    05/14/2017 at Unknown time  . cyclobenzaprine (FLEXERIL) 5 MG tablet Take 1 tablet (5 mg total) by mouth every 8 (eight) hours as needed for muscle spasms. (Patient not taking: Reported on 05/15/2017) 15 tablet 0 Not Taking at Unknown time     Review of Systems   All systems reviewed and negative except as stated in HPI  Physical Exam Blood  pressure 125/69, pulse 75, temperature (!) 97.2 F (36.2 C), temperature source Axillary, resp. rate (!) 22, height 5\' 5"  (1.651 m), weight 151 lb (68.5 kg), last menstrual period 08/18/2016. General appearance: alert, cooperative, appears stated age and moderate distress during contractions Lungs: clear to auscultation bilaterally Heart: regular rate and rhythm Abdomen: soft, non-tender; bowel sounds normal Extremities: No calf swelling or tenderness Presentation: cephalic Fetal monitoring: 130 bpm, mod var, +accels, occasional variable decels  Uterine activity: q2-37min Dilation: 10 Effacement (%): 100 Station: +1 Exam by:: Shauna Hugh    Prenatal labs: ABO, Rh: O/Positive/-- (06/27 0846) Antibody: Negative (06/27 0846) Rubella: !Error! IMMUNE RPR: Non Reactive (06/27 0846)  HBsAg: Negative (06/27 0846)  HIV: Non Reactive (06/27 0846)  GBS: Positive (06/27 0836)  1 hr Glucola: UNABLE TO PERFORM - random CBG was 100, never drew HbA1c Genetic screening: did not perform Anatomy US: Normal  Prenatal Transfer Tool  Maternal Diabetes: No - never performed Genetic Screening: Declined Maternal Ultrasounds/Referrals: Normal Fetal Ultrasounds or other Referrals:  None Maternal Substance Abuse:  No Significant Maternal Medications:  None Significant Maternal Lab Results: Lab values include: Group B Strep positive  Results for orders placed or performed during the hospital encounter of 05/15/17 (from the past 24 hour(s))  CBC   Collection Time: 05/15/17  2:50 PM  Result Value Ref Range   WBC 11.9 (H) 4.0 - 10.5 K/uL   RBC 4.36 3.87 - 5.11 MIL/uL   Hemoglobin 12.2 12.0 - 15.0 g/dL   HCT 16.137.7 09.636.0 - 04.546.0 %   MCV 86.5 78.0 - 100.0 fL   MCH 28.0 26.0 - 34.0 pg   MCHC 32.4 30.0 - 36.0 g/dL   RDW 40.915.5 81.111.5 - 91.415.5 %   Platelets 177 150 - 400 K/uL    Patient Active Problem List   Diagnosis Date Noted  . Normal labor 05/15/2017  . Supervision of normal first pregnancy, antepartum  05/07/2017  . Back pain affecting pregnancy in third trimester 03/26/2017  . Irregular uterine contractions 03/26/2017    Assessment: Heather Whitaker is a 21 y.o. G1P0000 at 5250w4d here for SOL with advanced dilatation and precipitous delivery.   #Labor: SOL, SROM prior to delivery, light mec, see delivery note #Pain: Unable to get epidural #FWB: Cat I #ID:  GBS Pos - Ampicillin given x1 #MOF: Bottle #MOC:Nexplanon #Circ:  Outpatient  Jen MowElizabeth Avory Rahimi, DO OB Fellow [facility] 05/15/2017, 3:21 PM

## 2017-05-15 NOTE — MAU Note (Signed)
Pt seen in MAU during the night, DC'd home.  Contractions are stronger, denies bleeding or LOF.

## 2017-05-15 NOTE — Discharge Instructions (Signed)
Abdominal Pain During Pregnancy °Belly (abdominal) pain is common during pregnancy. Most of the time, it is not a serious problem. Other times, it can be a sign that something is wrong with the pregnancy. Always tell your doctor if you have belly pain. °Follow these instructions at home: °Monitor your belly pain for any changes. The following actions may help you feel better: °· Do not have sex (intercourse) or put anything in your vagina until you feel better. °· Rest until your pain stops. °· Drink clear fluids if you feel sick to your stomach (nauseous). Do not eat solid food until you feel better. °· Only take medicine as told by your doctor. °· Keep all doctor visits as told. ° °Get help right away if: °· You are bleeding, leaking fluid, or pieces of tissue come out of your vagina. °· You have more pain or cramping. °· You keep throwing up (vomiting). °· You have pain when you pee (urinate) or have blood in your pee. °· You have a fever. °· You do not feel your baby moving as much. °· You feel very weak or feel like passing out. °· You have trouble breathing, with or without belly pain. °· You have a very bad headache and belly pain. °· You have fluid leaking from your vagina and belly pain. °· You keep having watery poop (diarrhea). °· Your belly pain does not go away after resting, or the pain gets worse. °This information is not intended to replace advice given to you by your health care provider. Make sure you discuss any questions you have with your health care provider. °Document Released: 10/16/2009 Document Revised: 06/05/2016 Document Reviewed: 05/27/2013 °Elsevier Interactive Patient Education © 2018 [employer] Inc[employer] ° ° °Braxton Hicks Contractions °Contractions of the uterus can occur throughout pregnancy, but they are not always a sign that you are in labor. You may have practice contractions called Braxton Hicks contractions. These false labor contractions are sometimes confused with true labor. °What  are Braxton Hicks contractions? °Braxton Hicks contractions are tightening movements that occur in the muscles of the uterus before labor. Unlike true labor contractions, these contractions do not result in opening (dilation) and thinning of the cervix. Toward the end of pregnancy (32-34 weeks), Braxton Hicks contractions can happen more often and may become stronger. These contractions are sometimes difficult to tell apart from true labor because they can be very uncomfortable. You should not feel embarrassed if you go to the hospital with false labor. °Sometimes, the only way to tell if you are in true labor is for your health care provider to look for changes in the cervix. The health care provider will do a physical exam and may monitor your contractions. If you are not in true labor, the exam should show that your cervix is not dilating and your water has not broken. °If there are no prenatal problems or other health problems associated with your pregnancy, it is completely safe for you to be sent home with false labor. You may continue to have Braxton Hicks contractions until you go into true labor. °How can I tell the difference between true labor and false labor? °· Differences °? False labor °? Contractions last 30-70 seconds.: Contractions are usually shorter and not as strong as true labor contractions. °? Contractions become very regular.: Contractions are usually irregular. °? Discomfort is usually felt in the top of the uterus, and it spreads to the lower abdomen and low back.: Contractions are often felt in the   front of the lower abdomen and in the groin. °? Contractions do not go away with walking.: Contractions may go away when you walk around or change positions while lying down. °? Contractions usually become more intense and increase in frequency.: Contractions get weaker and are shorter-lasting as time goes on. °? The cervix dilates and gets thinner.: The cervix usually does not dilate or become  thin. °Follow these instructions at home: °· Take over-the-counter and prescription medicines only as told by your health care provider. °· Keep up with your usual exercises and follow other instructions from your health care provider. °· Eat and drink lightly if you think you are going into labor. °· If Braxton Hicks contractions are making you uncomfortable: °? Change your position from lying down or resting to walking, or change from walking to resting. °? Sit and rest in a tub of warm water. °? Drink enough fluid to keep your urine clear or pale yellow. Dehydration may cause these contractions. °? Do slow and deep breathing several times an hour. °· Keep all follow-up prenatal visits as told by your health care provider. This is important. °Contact a health care provider if: °· You have a fever. °· You have continuous pain in your abdomen. °Get help right away if: °· Your contractions become stronger, more regular, and closer together. °· You have fluid leaking or gushing from your vagina. °· You pass blood-tinged mucus (bloody show). °· You have bleeding from your vagina. °· You have low back pain that you never had before. °· You feel your baby’s head pushing down and causing pelvic pressure. °· Your baby is not moving inside you as much as it used to. °Summary °· Contractions that occur before labor are called Braxton Hicks contractions, false labor, or practice contractions. °· Braxton Hicks contractions are usually shorter, weaker, farther apart, and less regular than true labor contractions. True labor contractions usually become progressively stronger and regular and they become more frequent. °· Manage discomfort from Braxton Hicks contractions by changing position, resting in a warm bath, drinking plenty of water, or practicing deep breathing. °This information is not intended to replace advice given to you by your health care provider. Make sure you discuss any questions you have with your health care  provider. °Document Released: 10/28/2005 Document Revised: 09/16/2016 Document Reviewed: 09/16/2016 °Elsevier Interactive Patient Education © 2017 Elsevier Inc. ° °

## 2017-05-15 NOTE — MAU Note (Signed)
Patient presents with abdominal cramping beginning last night around 2200.  States she had some light bleeding when she wiped around 0100.  Reports good FM, no LOF.

## 2017-05-15 NOTE — MAU Note (Signed)
G1P0 at 38.4 weeks, posible SROM at 1345 on 05/15/2017.  No medical or Ob concerns.

## 2017-05-15 NOTE — MAU Note (Signed)
Discussed with Dr Omer JackMumaw and Dr Talbert ForestShirley pt's complaint and pain level after meds. Discussed unchanged SVE. EFM tracing reviewed by Mumaw,MD.

## 2017-05-15 NOTE — MAU Note (Signed)
I have communicated with Dr. Mosetta PuttFeng and reviewed vital signs:  Vitals:   05/15/17 0214  BP: 107/67  Pulse: 91  Resp: 17  Temp: 98.4 F (36.9 C)    Vaginal exam:  Dilation: Fingertip Effacement (%): 70 Cervical Position: Middle Station: -3 Presentation: Vertex Exam by:: A Shainna Faux, RN,   Also reviewed contraction pattern and that non-stress test is reactive.  It has been documented that patient is contracting irregularly with no cervical change over 1.5 hours not indicating active labor.  Patient denies any other complaints.  Based on this report provider has given order for discharge.  A discharge order and diagnosis entered by a provider.   Labor discharge instructions reviewed with patient.

## 2017-05-16 LAB — RPR: RPR Ser Ql: NONREACTIVE

## 2017-05-16 NOTE — Progress Notes (Signed)
UR chart review completed.  

## 2017-05-16 NOTE — Progress Notes (Signed)
Post Partum Day 1 Subjective: no complaints, up ad lib and bottle feeding  Objective: Blood pressure 116/70, pulse 70, temperature 98.4 F (36.9 C), temperature source Oral, resp. rate 16, height 5\' 5"  (1.651 m), weight 151 lb (68.5 kg), last menstrual period 08/18/2016, SpO2 98 %, unknown if currently breastfeeding.  Physical Exam:  General: alert and cooperative Lochia: appropriate Uterine Fundus: firm Incision: N/A DVT Evaluation: No evidence of DVT seen on physical exam.   Recent Labs  05/15/17 1450  HGB 12.2  HCT 37.7    Assessment/Plan: Plan for discharge tomorrow and Contraception plans Nexplanon    LOS: 1 day   Vonzella NippleJulie Pope Brunty, PA-C 05/16/2017, 6:55 AM

## 2017-05-17 MED ORDER — IBUPROFEN 600 MG PO TABS: 600.0000 mg | ORAL_TABLET | Freq: Four times a day (QID) | ORAL | 0 refills | Status: DC

## 2017-05-17 MED ORDER — SENNOSIDES-DOCUSATE SODIUM 8.6-50 MG PO TABS: 1.0000 | ORAL_TABLET | Freq: Every evening | ORAL | 0 refills | Status: DC | PRN

## 2017-05-17 NOTE — Clinical Social Work Maternal (Signed)
  CLINICAL SOCIAL WORK MATERNAL/CHILD NOTE  Patient Details  Name: Heather Whitaker MRN: [id number] Date of Birth: 03-15-1996  Date:  05/17/2017  Clinical Social Worker Initiating Note:  Laurey Arrow Date/ Time Initiated:  05/17/17/1145     Child's Name:  Ardine Eng   Legal Guardian:  Mother (FOB is Sharren Bridge 08/22/1994)   Need for Interpreter:  None   Date of Referral:  05/17/17     Reason for Referral:  Other (Comment) (Concerns regarding MOB's parenting skills)   Referral Source:  Central Nursery   Address:  [address] [place] [address]  Phone number:  [phone]   Household Members:  Self, Significant Other   Natural Supports (not living in the home):  Immediate Family, Spouse/significant other, Extended Family   Professional Supports: None (MOB declined resources for professional supports. )   Employment:     Type of Work:     Education:      Pensions consultant:  [employer]   Other Resources:      Cultural/Religious Considerations Which May Impact Care:  None Reported  Strengths:  Ability to meet basic needs , Home prepared for child    Risk Factors/Current Problems:  Other (Comment) (Parenting skills.)   Cognitive State:  Able to Concentrate , Alert , Linear Thinking    Mood/Affect:  Calm , Flat , Comfortable    CSW Assessment: CSW met with MOB to complete an assessment regarding concerns for MOB's parenting skills.  When CSW arrived, MOB was resting in bed, infant was asleep in bassinet, and FOB was asleep on the couch.  With MOB's permission, CSW asked FOB to leave the room in effort to meet with MOB in private.  FOB appeared irritated as evidence by FOB's facial expression and deep breathing. CSW explained CSW's role and asked about supports for MOB and infant.  MOB reported a wealth of support from MOB's and FOB's immediate and extended family members. MOB reported the MOB's grandmother has taken off for 2 weeks to stay with  MOB to assist with the care of infant. CSW offered MOB in-home resources for parenting and child development education and MOB declined.  MOB communicated that MOB feels prepared to care for infant and has all necessary items.  MOB also stated the FOB will be home with MOB and can provided any assistance if needed. MOB denied MH concerns and DV.    CSW thanked MOB for meeting with CSW and provided MOB with CSW contact information.   CSW Plan/Description:  Information/Referral to [employer] , [employer] , No Further Intervention Required/No Barriers to Discharge   Laurey Arrow, MSW, LCSW Clinical Social Work [phone]    Dimple Nanas, LCSW 05/17/2017, 11:49 AM

## 2017-05-17 NOTE — Discharge Instructions (Signed)

## 2017-05-17 NOTE — Discharge Summary (Signed)
OB Discharge Summary     Patient Name: Heather Whitaker DOB: 07/05/1996 MRN: [id number]  Date of admission: 05/15/2017 Delivering MD: Jen MowMUMAW, ELIZABETH [facility]   Date of discharge: 05/17/2017  Admitting diagnosis: LABOR Intrauterine pregnancy: 3526w4d     Secondary diagnosis:  Active Problems:   Normal labor  Additional problems:  Patient Active Problem List   Diagnosis Date Noted  . Normal labor 05/15/2017  . Supervision of normal first pregnancy, antepartum 05/07/2017  . Back pain affecting pregnancy in third trimester 03/26/2017  . Irregular uterine contractions 03/26/2017       Discharge diagnosis: Term Pregnancy Delivered                                                                                                Post partum procedures:none  Augmentation: none, preciptious labor  Complications: None  Hospital course:  Onset of Labor With Vaginal Delivery     21 y.o. yo G1P1001 at 8226w4d was admitted in Active Labor on 05/15/2017. Patient had an uncomplicated labor course as follows:  Membrane Rupture Time/Date: 3:50 PM ,05/15/2017   Intrapartum Procedures: Episiotomy: None [1]                                         Lacerations:  1st degree [2]  Patient had a delivery of a Viable infant. 05/15/2017  Information for the patient's newborn:  Woody SellerCobb, Boy Monic [[id number]][[id number]]  Delivery Method: Vag-Spont    Pateint had an uncomplicated postpartum course.  She is ambulating, tolerating a regular diet, passing flatus, and urinating well. Patient is discharged home in stable condition on 05/17/17.   Physical exam  Vitals:   05/15/17 2214 05/16/17 0540 05/16/17 1900 05/17/17 0605  BP: (!) 98/55 116/70 95/69 (!) 106/41  Pulse: 72 70 63 (!) 52  Resp: 16 16 18 18   Temp: 98.8 F (37.1 C) 98.4 F (36.9 C) 98.7 F (37.1 C) 98.3 F (36.8 C)  TempSrc: Oral Oral Oral Oral  SpO2:  98%    Weight:      Height:       General: alert, cooperative and no distress Lochia: appropriate Uterine  Fundus: firm Incision: N/A DVT Evaluation: No evidence of DVT seen on physical exam. Labs: Lab Results  Component Value Date   WBC 11.9 (H) 05/15/2017   HGB 12.2 05/15/2017   HCT 37.7 05/15/2017   MCV 86.5 05/15/2017   PLT 177 05/15/2017   CMP Latest Ref Rng & Units 05/07/2017  Glucose 65 - 99 mg/dL 76  BUN 6 - 20 mg/dL -  Creatinine 7.820.44 - 9.561.00 mg/dL -  Sodium 213135 - 086145 mmol/L -  Potassium 3.5 - 5.1 mmol/L -  Chloride 101 - 111 mmol/L -  CO2 22 - 32 mmol/L -  Calcium 8.9 - 10.3 mg/dL -  Total Protein 6.5 - 8.1 g/dL -  Total Bilirubin 0.3 - 1.2 mg/dL -  Alkaline Phos 38 - 578126 U/L -  AST 15 - 41 U/L -  ALT 14 -  54 U/L -    Discharge instruction: per After Visit Summary and "Baby and Me Booklet".  After visit meds:  Allergies as of 05/17/2017   No Known Allergies     Medication List    TAKE these medications   cyclobenzaprine 5 MG tablet Commonly known as:  FLEXERIL Take 1 tablet (5 mg total) by mouth every 8 (eight) hours as needed for muscle spasms.   ibuprofen 600 MG tablet Commonly known as:  ADVIL,MOTRIN Take 1 tablet (600 mg total) by mouth every 6 (six) hours.   prenatal multivitamin Tabs tablet Take 1 tablet by mouth daily at 12 noon.   senna-docusate 8.6-50 MG tablet Commonly known as:  Senokot-S Take 1 tablet by mouth at bedtime as needed for mild constipation.       Diet: routine diet  Activity: Advance as tolerated. Pelvic rest for 6 weeks.   Outpatient follow up:6 weeks Follow up Appt:No future appointments. Follow up Visit:No Follow-up on file.  Postpartum contraception: Nexplanon  Newborn Data: Live born female  Birth Weight: 7 lb 1.8 oz (3226 g) APGAR: 8, 9  Baby Feeding: Breast Disposition:home with mother   05/17/2017 Howard Pouch, MD  Patient was seen and examined by me also Agree with note Vitals stable Labs stable Fundus firm, lochia within normal limits Perineum healing Ext WNL Continue care  Ready for  discharge  Aviva Signs, CNM

## 2017-05-20 ENCOUNTER — Encounter: Payer: Self-pay | Admitting: General Practice

## 2017-06-08 ENCOUNTER — Encounter (HOSPITAL_COMMUNITY): Payer: Self-pay | Admitting: Emergency Medicine

## 2017-06-08 ENCOUNTER — Emergency Department (HOSPITAL_COMMUNITY)
Admission: EM | Admit: 2017-06-08 | Discharge: 2017-06-08 | Disposition: A | Discharge status: Home / Self Care | Payer: Medicaid Other | Attending: Emergency Medicine | Admitting: Emergency Medicine

## 2017-06-08 DIAGNOSIS — L02412 Cutaneous abscess of left axilla: Secondary | ICD-10-CM | POA: Diagnosis not present

## 2017-06-08 MED ORDER — LIDOCAINE-EPINEPHRINE (PF) 2 %-1:200000 IJ SOLN
10.0000 mL | Freq: Once | INTRAMUSCULAR | Status: AC
  Administered 2017-06-08: 10 mL
  Filled 2017-06-08: qty 20

## 2017-06-08 NOTE — Discharge Instructions (Signed)
Use Tylenol or ibuprofen as needed for pain. Keep the area covered for the next 24 hours. Do not get the area wet. After this, you may wash gently with soap and water, and reapply dressing. Return to the emergency department if you develop fevers, chills, worsening pain, or any new or worsening symptoms.

## 2017-06-08 NOTE — ED Triage Notes (Signed)
Pt with abscess at L axilla. Pt denies drainage from site. Site painful.

## 2017-06-08 NOTE — ED Provider Notes (Signed)
[facility]-EMERGENCY DEPT Provider Note   CSN: [id number] Arrival date & time: 06/08/17  1155     History   Chief Complaint Chief Complaint  Patient presents with  . Abscess    L axilla    HPI Heather Whitaker is a 21 y.o. female presenting with one-week history of left axilla pain.  Patient states that she first noticed the lesion in her left armpit one week ago. Since then, it has grown in size and become more painful. Nothing makes the pain worse, and nothing makes it better. She denies drainage from the area. She denies history of abscesses in the past. She's not been using deodorant, to try and help the symptoms. She has not taken anything for pain including Tylenol or ibuprofen. She denies systemic fevers or chills, nausea, vomiting, or other symptoms.  HPI  Past Medical History:  Diagnosis Date  . Back pain affecting pregnancy in third trimester 03/26/2017  . Irregular uterine contractions 03/26/2017  . Medical history non-contributory     Patient Active Problem List   Diagnosis Date Noted  . Normal labor 05/15/2017  . Supervision of normal first pregnancy, antepartum 05/07/2017  . Back pain affecting pregnancy in third trimester 03/26/2017  . Irregular uterine contractions 03/26/2017    Past Surgical History:  Procedure Laterality Date  . MYRINGOTOMY WITH TUBE PLACEMENT      OB History    Gravida Para Term Preterm AB Living   1 1 1  0 0 1   SAB TAB Ectopic Multiple Live Births   0 0 0 0 1       Home Medications    Prior to Admission medications   Medication Sig Start Date End Date Taking? Authorizing Provider  cyclobenzaprine (FLEXERIL) 5 MG tablet Take 1 tablet (5 mg total) by mouth every 8 (eight) hours as needed for muscle spasms. Patient not taking: Reported on 05/15/2017 05/14/17   Aviva SignsWilliams, Marie L, CNM  ibuprofen (ADVIL,MOTRIN) 600 MG tablet Take 1 tablet (600 mg total) by mouth every 6 (six) hours. 05/17/17   Howard PouchFeng, Lauren, MD  Prenatal Vit-Fe Fumarate-FA  (PRENATAL MULTIVITAMIN) TABS tablet Take 1 tablet by mouth daily at 12 noon.     [provider]  senna-docusate (SENOKOT-S) 8.6-50 MG tablet Take 1 tablet by mouth at bedtime as needed for mild constipation. 05/17/17   Howard PouchFeng, Lauren, MD    Family History Family History  Problem Relation Age of Onset  . Hypertension Maternal Grandfather   . Asthma Brother   . Cancer Neg Hx   . Diabetes Neg Hx     Social History Social History  Substance Use Topics  . Smoking status: Never Smoker  . Smokeless tobacco: Never Used  . Alcohol use No     Allergies   Patient has no known allergies.   Review of Systems Review of Systems  Constitutional: Negative for chills and fever.  Skin:       Painful lesion of L axilla    Physical Exam Updated Vital Signs BP 122/76 (BP Location: Right Arm)   Pulse 83   Temp 98.7 F (37.1 C) (Oral)   Resp 16   SpO2 99%   Physical Exam  Constitutional: She is oriented to person, place, and time. She appears well-developed and well-nourished. No distress.  HENT:  Head: Normocephalic and atraumatic.  Eyes: Pupils are equal, round, and reactive to light.  Neck: Normal range of motion.  Cardiovascular: Normal rate, regular rhythm and intact distal pulses.   Pulmonary/Chest: Effort  normal and breath sounds normal.  Abdominal: Soft. She exhibits no distension. There is no tenderness.  Musculoskeletal: Normal range of motion.  Neurological: She is alert and oriented to person, place, and time.  Skin: Skin is warm.  Patient with fluctuant well-circumscribed 1.5 cm abscess with obvious head of left upper arm just distal to the axilla. No surrounding cellulitis noted. No drainage at this time.  Psychiatric: She has a normal mood and affect.  Nursing note and vitals reviewed.    ED Treatments / Results  Labs (all labs ordered are listed, but only abnormal results are displayed) Labs Reviewed - No data to display  EKG  EKG Interpretation None        Radiology No results found.  Procedures .Marland Kitchen.Incision and Drainage Date/Time: 06/08/2017 1:00 PM Performed by: Lisbeth RenshawACCAVALE, Deionte Spivack Authorized by: Alveria ApleyACCAVALE, Marico Buckle   Consent:    Consent obtained:  Verbal   Consent given by:  Patient   Risks discussed:  Bleeding, incomplete drainage, pain and infection   Alternatives discussed:  No treatment and alternative treatment Location:    Type:  Abscess   Size:  1.5 cm   Location:  Upper extremity   Upper extremity location:  Arm   Arm location:  L upper arm Pre-procedure details:    Skin preparation:  Betadine Anesthesia (see MAR for exact dosages):    Anesthesia method:  Local infiltration   Local anesthetic:  Lidocaine 2% WITH epi Procedure type:    Complexity:  Simple Procedure details:    Needle aspiration: no     Incision types:  Single straight   Scalpel blade:  11   Wound management:  Probed and deloculated and irrigated with saline   Drainage:  Purulent   Drainage amount:  Moderate   Packing materials:  None Post-procedure details:    Patient tolerance of procedure:  Tolerated well, no immediate complications    (including critical care time)   Medications Ordered in ED Medications  lidocaine-EPINEPHrine (XYLOCAINE W/EPI) 2 %-1:200000 (PF) injection 10 mL (10 mLs Infiltration Given 06/08/17 1337)     Initial Impression / Assessment and Plan / ED Course  I have reviewed the triage vital signs and the nursing notes.  Pertinent labs & imaging results that were available during my care of the patient were reviewed by me and considered in my medical decision making (see chart for details).     Pt with abscess of L upper arm just distal to axilla. No surrounding erythema. I and D expressed purulent fluids. No systemic signs of infection, and I do not feel abx are necessary at this time, especially as pt is breast feeing. Aftercare instructions given. Return precautions given. Pt states she understands and agrees to  plan.   Final Clinical Impressions(s) / ED Diagnoses   Final diagnoses:  Abscess of left axilla    New Prescriptions Discharge Medication List as of 06/08/2017  1:38 PM       Alveria ApleyCaccavale, Eline Geng, PA-C 06/08/17 2116    Nira Connardama, Pedro Eduardo, MD 06/09/17 [phone]

## 2017-06-26 ENCOUNTER — Ambulatory Visit (INDEPENDENT_AMBULATORY_CARE_PROVIDER_SITE_OTHER): Payer: Medicaid Other | Admitting: Family Medicine

## 2017-06-26 ENCOUNTER — Encounter: Payer: Self-pay | Admitting: Family Medicine

## 2017-06-26 DIAGNOSIS — Z3042 Encounter for surveillance of injectable contraceptive: Secondary | ICD-10-CM | POA: Diagnosis not present

## 2017-06-26 DIAGNOSIS — Z3689 Encounter for other specified antenatal screening: Secondary | ICD-10-CM

## 2017-06-26 DIAGNOSIS — Z3202 Encounter for pregnancy test, result negative: Secondary | ICD-10-CM

## 2017-06-26 DIAGNOSIS — Z30013 Encounter for initial prescription of injectable contraceptive: Secondary | ICD-10-CM

## 2017-06-26 DIAGNOSIS — Z3009 Encounter for other general counseling and advice on contraception: Secondary | ICD-10-CM

## 2017-06-26 LAB — POCT PREGNANCY, URINE: PREG TEST UR: NEGATIVE

## 2017-06-26 MED ORDER — MEDROXYPROGESTERONE ACETATE 150 MG/ML IM SUSP
150.0000 mg | Freq: Once | INTRAMUSCULAR | Status: AC
  Administered 2017-06-26: 150 mg via INTRAMUSCULAR

## 2017-06-26 NOTE — Progress Notes (Signed)
Subjective:     Heather Whitaker is a 21 y.o. female who presents for a postpartum visit. She is 6 weeks postpartum following a spontaneous vaginal delivery. I have fully reviewed the prenatal and intrapartum course. The delivery was at 38 gestational weeks. Outcome: spontaneous vaginal delivery. Anesthesia: local. Postpartum course has been uncomplicated. Baby's course has been uncomplicated. Baby is feeding by bottle - Similac Advance. Bleeding no bleeding. Bowel function is normal. Bladder function is normal. Patient is not sexually active. Contraception method is undecided; considering OCPs. Postpartum depression screening: negative.  The following portions of the patient's history were reviewed and updated as appropriate: allergies, current medications, past family history, past medical history, past social history, past surgical history and problem list.  Review of Systems Pertinent items are noted in HPI.   Objective:    There were no vitals taken for this visit.  General:  alert, cooperative and no distress  Lungs: clear to auscultation bilaterally  Heart:  regular rate and rhythm, no murmur  Abdomen: Soft, nontender, nondistended   Skin:  Warm and dry  MSK: No LE edema, BLE symmetrical w/o tenderness  Psych:  Normal mood and affect        Assessment:    Normal postpartum exam.   Plan:    1. Contraception: counseled extensively on LARCs today. Pt decided to get Depo today, but will consider IUD vs Nexplanon. Plantlets given. Follow up in 12-weeks for Depo vs other contraceptive method 2. Pap smear due. Pt declined today, but agreeable to do at follow in 12 weeks     Kandra NicolasJulie P. Monzerrath Mcburney, MD OB Fellow

## 2017-09-17 ENCOUNTER — Ambulatory Visit: Payer: Self-pay

## 2017-09-17 ENCOUNTER — Ambulatory Visit (INDEPENDENT_AMBULATORY_CARE_PROVIDER_SITE_OTHER): Payer: Medicaid Other | Admitting: *Deleted

## 2017-09-17 VITALS — BP 109/67 | HR 91

## 2017-09-17 DIAGNOSIS — Z3049 Encounter for surveillance of other contraceptives: Secondary | ICD-10-CM

## 2017-09-17 DIAGNOSIS — Z3042 Encounter for surveillance of injectable contraceptive: Secondary | ICD-10-CM

## 2017-09-17 MED ORDER — MEDROXYPROGESTERONE ACETATE 150 MG/ML IM SUSP
150.0000 mg | Freq: Once | INTRAMUSCULAR | Status: AC
  Administered 2017-09-17: 150 mg via INTRAMUSCULAR

## 2017-12-05 ENCOUNTER — Encounter (HOSPITAL_COMMUNITY): Payer: Self-pay | Admitting: Emergency Medicine

## 2017-12-05 ENCOUNTER — Emergency Department (HOSPITAL_COMMUNITY)
Admission: EM | Admit: 2017-12-05 | Discharge: 2017-12-05 | Disposition: A | Discharge status: Home / Self Care | Payer: Medicaid Other | Attending: Emergency Medicine | Admitting: Emergency Medicine

## 2017-12-05 ENCOUNTER — Other Ambulatory Visit: Payer: Self-pay

## 2017-12-05 DIAGNOSIS — N898 Other specified noninflammatory disorders of vagina: Secondary | ICD-10-CM

## 2017-12-05 DIAGNOSIS — B9689 Other specified bacterial agents as the cause of diseases classified elsewhere: Secondary | ICD-10-CM

## 2017-12-05 DIAGNOSIS — N760 Acute vaginitis: Secondary | ICD-10-CM | POA: Insufficient documentation

## 2017-12-05 LAB — URINALYSIS, ROUTINE W REFLEX MICROSCOPIC
BILIRUBIN URINE: NEGATIVE
Bacteria, UA: NONE SEEN
GLUCOSE, UA: NEGATIVE mg/dL
Ketones, ur: 5 mg/dL — AB
LEUKOCYTES UA: NEGATIVE
NITRITE: NEGATIVE
PH: 6 (ref 5.0–8.0)
Protein, ur: NEGATIVE mg/dL
Specific Gravity, Urine: 1.027 (ref 1.005–1.030)

## 2017-12-05 LAB — WET PREP, GENITAL
SPERM: NONE SEEN
Trich, Wet Prep: NONE SEEN
YEAST WET PREP: NONE SEEN

## 2017-12-05 LAB — PREGNANCY, URINE: Preg Test, Ur: NEGATIVE

## 2017-12-05 MED ORDER — METRONIDAZOLE 0.75 % VA GEL: 1.0000 | Freq: Two times a day (BID) | VAGINAL | 0 refills | Status: DC

## 2017-12-05 NOTE — Discharge Instructions (Signed)
1. Medications: metrogel, usual home medications 2. Treatment: rest, drink plenty of fluids, use a condom with every sexual encounter 3. Follow Up: Please followup with your primary doctor or OB/GYN in 1 week for discussion of your diagnoses and further evaluation after today's visit; if you do not have a primary care doctor use the resource guide provided to find one; Please return to the ER for worsening symptoms, high fevers or persistent vomiting.  You have been tested for chlamydia and gonorrhea.  These results will be available in approximately 3 days.  Please inform all sexual partners if you test positive for any of these diseases.

## 2017-12-05 NOTE — ED Provider Notes (Signed)
[facility] EMERGENCY DEPARTMENT Provider Note   CSN: [id number] Arrival date & time: 12/05/17  1503     History   Chief Complaint Chief Complaint  Patient presents with  . Vaginal Discharge    Vaginal Smell     HPI Heather Whitaker is a 22 y.o. female with no major medical problems presents to the Emergency Department complaining of gradual, persistent, progressively worsening vaginal odor onset 4 mos.  Pt reports 1 female partner for the last 2 years with both partners have been STD free.  Pt denies vaginal discharge, dysuria, vaginal pain, vaginal lesions.  Pt reports she has been using unscented soap.  Pt denies douching.  Pt reports the smell resolves after a shower, but returns by the next morning. Pt denies fever, chills, abd pain, CP, SOB, N/V/D, dysuria, hematuria, back pain.  LMP: unknown, pt reports she is using Depo and has intermittent spotting.     The history is provided by the patient and medical records. No language interpreter was used.    Past Medical History:  Diagnosis Date  . Back pain affecting pregnancy in third trimester 03/26/2017  . Irregular uterine contractions 03/26/2017  . Medical history non-contributory     There are no active problems to display for this patient.   Past Surgical History:  Procedure Laterality Date  . MYRINGOTOMY WITH TUBE PLACEMENT      OB History    Gravida Para Term Preterm AB Living   1 1 1  0 0 1   SAB TAB Ectopic Multiple Live Births   0 0 0 0 1       Home Medications    Prior to Admission medications   Medication Sig Start Date End Date Taking? Authorizing Provider  medroxyPROGESTERone (DEPO-PROVERA) 150 MG/ML injection Inject 150 mg into the muscle every 3 (three) months.   Yes [provider]  metroNIDAZOLE (METROGEL VAGINAL) 0.75 % vaginal gel Place 1 Applicatorful vaginally 2 (two) times daily. 12/05/17   Quanasia Defino, Dahlia Client, PA-C    Family History Family History  Problem Relation  Age of Onset  . Hypertension Maternal Grandfather   . Asthma Brother   . Cancer Neg Hx   . Diabetes Neg Hx     Social History Social History   Tobacco Use  . Smoking status: Never Smoker  . Smokeless tobacco: Never Used  Substance Use Topics  . Alcohol use: No  . Drug use: No     Allergies   Patient has no known allergies.   Review of Systems Review of Systems  Constitutional: Negative for appetite change, diaphoresis, fatigue, fever and unexpected weight change.  HENT: Negative for mouth sores.   Eyes: Negative for visual disturbance.  Respiratory: Negative for cough, chest tightness, shortness of breath and wheezing.   Cardiovascular: Negative for chest pain.  Gastrointestinal: Negative for abdominal pain, constipation, diarrhea, nausea and vomiting.  Endocrine: Negative for polydipsia, polyphagia and polyuria.  Genitourinary: Negative for dysuria, frequency, hematuria, urgency, vaginal bleeding, vaginal discharge and vaginal pain.       Vaginal odor  Musculoskeletal: Negative for back pain and neck stiffness.  Skin: Negative for rash.  Allergic/Immunologic: Negative for immunocompromised state.  Neurological: Negative for syncope, light-headedness and headaches.  Hematological: Does not bruise/bleed easily.  Psychiatric/Behavioral: Negative for sleep disturbance. The patient is not nervous/anxious.      Physical Exam Updated Vital Signs BP 103/85   Pulse 95   Temp 97.9 F (36.6 C) (Oral)   Resp 14  Ht 5' 5.5" (1.664 m)   Wt 60.8 kg (134 lb)   LMP  (LMP Unknown)   SpO2 97%   BMI 21.96 kg/m   Physical Exam  Constitutional: She appears well-developed and well-nourished. No distress.  HENT:  Head: Normocephalic and atraumatic.  Eyes: Conjunctivae are normal.  Neck: Normal range of motion.  Cardiovascular: Normal rate, regular rhythm, normal heart sounds and intact distal pulses.  No murmur heard. Pulmonary/Chest: Effort normal and breath sounds normal.  No respiratory distress. She has no wheezes.  Abdominal: Soft. Bowel sounds are normal. There is no tenderness. There is no rebound and no guarding. Hernia confirmed negative in the right inguinal area and confirmed negative in the left inguinal area.  Genitourinary: Uterus normal. No labial fusion. There is no rash, tenderness or lesion on the right labia. There is no rash, tenderness or lesion on the left labia. Uterus is not deviated, not enlarged, not fixed and not tender. Cervix exhibits no motion tenderness, no discharge and no friability. Right adnexum displays no mass, no tenderness and no fullness. Left adnexum displays no mass, no tenderness and no fullness. No erythema, tenderness or bleeding in the vagina. No foreign body in the vagina. No signs of injury around the vagina. Vaginal discharge (scant brown) found.  Musculoskeletal: Normal range of motion. She exhibits no edema.  Lymphadenopathy:       Right: No inguinal adenopathy present.       Left: No inguinal adenopathy present.  Neurological: She is alert.  Skin: Skin is warm and dry. She is not diaphoretic. No erythema.  Psychiatric: She has a normal mood and affect.  Nursing note and vitals reviewed.    ED Treatments / Results  Labs (all labs ordered are listed, but only abnormal results are displayed) Labs Reviewed  WET PREP, GENITAL - Abnormal; Notable for the following components:      Result Value   Clue Cells Wet Prep HPF POC PRESENT (*)    WBC, Wet Prep HPF POC MANY (*)    All other components within normal limits  URINALYSIS, ROUTINE W REFLEX MICROSCOPIC - Abnormal; Notable for the following components:   Hgb urine dipstick MODERATE (*)    Ketones, ur 5 (*)    Squamous Epithelial / LPF 0-5 (*)    All other components within normal limits  PREGNANCY, URINE  GC/CHLAMYDIA PROBE AMP ([place]) NOT AT [facility]     Procedures Procedures (including critical care time)  Medications Ordered in ED Medications - No  data to display   Initial Impression / Assessment and Plan / ED Course  I have reviewed the triage vital signs and the nursing notes.  Pertinent labs & imaging results that were available during my care of the patient were reviewed by me and considered in my medical decision making (see chart for details).     Presents with 4 months of vaginal odor.  Denies vaginal discharge or urinary symptoms. Neg Preg test. No evidence of urinary tract infection.  No cervical motion tenderness or adnexal tenderness.  Denies high risk status for STDs.  Wet prep shows clue cells.  We will treat as bacterial vaginosis with MetroGel.  Patient will be referred to OB/GYN.  Final Clinical Impressions(s) / ED Diagnoses   Final diagnoses:  BV (bacterial vaginosis)  Vaginal odor    ED Discharge Orders        Ordered    metroNIDAZOLE (METROGEL VAGINAL) 0.75 % vaginal gel  2 times daily  12/05/17 2213       Yurani Fettes, Dahlia ClientHannah, PA-C 12/05/17 2229    Tegeler, Canary Brimhristopher J, MD 12/06/17 [phone]

## 2017-12-05 NOTE — ED Triage Notes (Signed)
Patient reports vaginal smell, started in November and continued to get worse.

## 2017-12-08 ENCOUNTER — Ambulatory Visit (INDEPENDENT_AMBULATORY_CARE_PROVIDER_SITE_OTHER): Payer: Medicaid Other

## 2017-12-08 VITALS — BP 106/54 | HR 70

## 2017-12-08 DIAGNOSIS — Z309 Encounter for contraceptive management, unspecified: Secondary | ICD-10-CM

## 2017-12-08 DIAGNOSIS — Z3042 Encounter for surveillance of injectable contraceptive: Secondary | ICD-10-CM

## 2017-12-08 LAB — GC/CHLAMYDIA PROBE AMP (~~LOC~~) NOT AT ARMC
Chlamydia: NEGATIVE
NEISSERIA GONORRHEA: NEGATIVE

## 2017-12-08 MED ORDER — MEDROXYPROGESTERONE ACETATE 150 MG/ML IM SUSP
150.0000 mg | Freq: Once | INTRAMUSCULAR | Status: AC
  Administered 2017-12-08: 150 mg via INTRAMUSCULAR

## 2017-12-08 NOTE — Progress Notes (Signed)
I have reviewed the nursing note, and agree with the plan of care.  Heather ShellerHeather Evangelia Whitaker 3:12 PM 12/08/17

## 2017-12-08 NOTE — Progress Notes (Signed)
Pt presented to the office for a Depo injection. Next Depo is due April 15-April 29.

## 2018-02-24 ENCOUNTER — Ambulatory Visit: Payer: Medicaid Other

## 2018-02-25 ENCOUNTER — Ambulatory Visit: Payer: Medicaid Other

## 2018-02-25 ENCOUNTER — Other Ambulatory Visit: Payer: Self-pay

## 2018-02-25 ENCOUNTER — Emergency Department (HOSPITAL_COMMUNITY)
Admission: EM | Admit: 2018-02-25 | Discharge: 2018-02-25 | Disposition: A | Discharge status: Home / Self Care | Payer: Medicaid Other | Attending: Emergency Medicine | Admitting: Emergency Medicine

## 2018-02-25 DIAGNOSIS — N898 Other specified noninflammatory disorders of vagina: Secondary | ICD-10-CM | POA: Insufficient documentation

## 2018-02-25 LAB — URINALYSIS, ROUTINE W REFLEX MICROSCOPIC
BACTERIA UA: NONE SEEN
BILIRUBIN URINE: NEGATIVE
Glucose, UA: NEGATIVE mg/dL
HGB URINE DIPSTICK: NEGATIVE
Ketones, ur: 5 mg/dL — AB
NITRITE: NEGATIVE
PROTEIN: 30 mg/dL — AB
Specific Gravity, Urine: 1.028 (ref 1.005–1.030)
pH: 5 (ref 5.0–8.0)

## 2018-02-25 LAB — WET PREP, GENITAL
Clue Cells Wet Prep HPF POC: NONE SEEN
Sperm: NONE SEEN
Trich, Wet Prep: NONE SEEN
YEAST WET PREP: NONE SEEN

## 2018-02-25 LAB — POC URINE PREG, ED: Preg Test, Ur: NEGATIVE

## 2018-02-25 MED ORDER — METRONIDAZOLE 0.75 % VA GEL: 1.0000 | Freq: Two times a day (BID) | VAGINAL | 0 refills | Status: DC

## 2018-02-25 NOTE — Discharge Instructions (Addendum)
Your test for gonorrhea and chlamydia is pending.  If results are positive, you will receive a phone call.  If they are negative, you will not.  You may check on my chart either way. If results are positive,  you need to follow-up with the health department for treatment and make sure that your partner gets treatment. Results are negative, you may use the MetroGel for presumed bacterial vaginosis infection. Follow-up with health department if your symptoms are not improving with the MetroGel or if symptoms return. Return to the emergency room if you develop fevers, abdominal pain, vomiting, or any new or concerning symptoms.

## 2018-02-25 NOTE — ED Provider Notes (Signed)
[facility]   CSN: [id number] Arrival date & time: 02/25/18  1224     History   Chief Complaint Chief Complaint  Patient presents with  . Vaginal Odor    HPI Heather Whitaker is a 22 y.o. female presenting for evaluation of vaginal odor.   Pt states she has had a foul vaginal odor for the past several months.  This began in November when she had a very heavy period.  She was evaluated in the ER in January, diagnosed with BV infection, placed on MetroGel.  Her symptoms improved with the treatment, but after having sex again, the foul odor returned.  She denies discharge, dysuria, hematuria, urinary frequency, abdominal pain, nausea, or vomiting.  She reports she is only spotting, not having significant bleeding.  She was due for her depo-provera shot today, but wants to take a break and thus is not getting it.  She is socially active with one female partner who is asymptomatic.  She has no other medical problems, does not take medications daily.  HPI  Past Medical History:  Diagnosis Date  . Back pain affecting pregnancy in third trimester 03/26/2017  . Irregular uterine contractions 03/26/2017  . Medical history non-contributory     There are no active problems to display for this patient.   Past Surgical History:  Procedure Laterality Date  . MYRINGOTOMY WITH TUBE PLACEMENT       OB History    Gravida  1   Para  1   Term  1   Preterm  0   AB  0   Living  1     SAB  0   TAB  0   Ectopic  0   Multiple  0   Live Births  1            Home Medications    Prior to Admission medications   Medication Sig Start Date End Date Taking? Authorizing Provider  medroxyPROGESTERone (DEPO-PROVERA) 150 MG/ML injection Inject 150 mg into the muscle every 3 (three) months.    [provider]  metroNIDAZOLE (METROGEL VAGINAL) 0.75 % vaginal gel Place 1 Applicatorful vaginally 2 (two) times daily. 02/25/18   Kdyn Vonbehren,  Graylon Amory, PA-C    Family History Family History  Problem Relation Age of Onset  . Hypertension Maternal Grandfather   . Asthma Brother   . Cancer Neg Hx   . Diabetes Neg Hx     Social History Social History   Tobacco Use  . Smoking status: Never Smoker  . Smokeless tobacco: Never Used  Substance Use Topics  . Alcohol use: No  . Drug use: No     Allergies   Patient has no known allergies.   Review of Systems Review of Systems  Constitutional: Negative for chills and fever.  Respiratory: Negative for cough.   Cardiovascular: Negative for chest pain.  Gastrointestinal: Negative for abdominal pain, nausea and vomiting.  Genitourinary: Negative for dysuria, hematuria, urgency, vaginal bleeding, vaginal discharge and vaginal pain.       Vaginal odor  Musculoskeletal: Negative for back pain.  Skin: Negative for rash.  Allergic/Immunologic: Negative for immunocompromised state.  Neurological: Negative for headaches.  Hematological: Does not bruise/bleed easily.     Physical Exam Updated Vital Signs BP 107/77 (BP Location: Right Arm)   Pulse 92   Temp 98.7 F (37.1 C) (Oral)   Resp 18   Ht 5' 5.5" (1.664 m)   Wt 59 kg (130  lb)   SpO2 99%   BMI 21.30 kg/m   Physical Exam  Constitutional: She is oriented to person, place, and time. She appears well-developed and well-nourished. No distress.  HENT:  Head: Normocephalic and atraumatic.  Eyes: EOM are normal.  Neck: Normal range of motion.  Cardiovascular: Normal rate, regular rhythm and intact distal pulses.  Pulmonary/Chest: Effort normal and breath sounds normal. No respiratory distress. She has no wheezes.  Abdominal: Soft. She exhibits no distension. There is no tenderness.  Genitourinary: Rectum normal, vagina normal and uterus normal. Pelvic exam was performed with patient supine. There is no rash or tenderness on the right labia. There is no rash or tenderness on the left labia. Cervix exhibits no motion  tenderness, no discharge and no friability. Right adnexum displays no mass, no tenderness and no fullness. Left adnexum displays no mass, no tenderness and no fullness.  Genitourinary Comments: Chaperone present.  Thin white discharge noted on exam.  No CMT or adnexal tenderness.  Musculoskeletal: Normal range of motion.  Neurological: She is alert and oriented to person, place, and time.  Skin: Skin is warm. No rash noted.  Psychiatric: She has a normal mood and affect.  Nursing note and vitals reviewed.    ED Treatments / Results  Labs (all labs ordered are listed, but only abnormal results are displayed) Labs Reviewed  WET PREP, GENITAL - Abnormal; Notable for the following components:      Result Value   WBC, Wet Prep HPF POC MODERATE (*)    All other components within normal limits  URINALYSIS, ROUTINE W REFLEX MICROSCOPIC - Abnormal; Notable for the following components:   Ketones, ur 5 (*)    Protein, ur 30 (*)    Leukocytes, UA TRACE (*)    Squamous Epithelial / LPF 0-5 (*)    All other components within normal limits  POC URINE PREG, ED  GC/CHLAMYDIA PROBE AMP ([place]) NOT AT [facility]    EKG None  Radiology No results found.  Procedures Procedures (including critical care time)  Medications Ordered in ED Medications - No data to display   Initial Impression / Assessment and Plan / ED Course  I have reviewed the triage vital signs and the nursing notes.  Pertinent labs & imaging results that were available during my care of the patient were reviewed by me and considered in my medical decision making (see chart for details).     Patient presenting for evaluation of an abnormal vaginal odor.  Physical exam reassuring, she is afebrile, not tachycardic.  She appears nontoxic.  No abdominal pain.  GU exam shows thin white discharge without CMT or adnexal tenderness.  Wet prep positive for white cells, gonorrhea and chlamydia sent.  Although no clue cells on the wet  prep, physical exam consistent with BV.  Discussed findings with patient.  Discussed option of waiting for gonorrhea and Chlamydia tests to return before starting MetroGel.  Follow-up with health department as needed.  Discussed that if gonorrhea and chlamydia are positive, she will need treatment.  At this time, patient appears safe for discharge.  Return precautions given.  Patient states she understands and agrees to plan.  Final Clinical Impressions(s) / ED Diagnoses   Final diagnoses:  Vaginal odor    ED Discharge Orders        Ordered    metroNIDAZOLE (METROGEL VAGINAL) 0.75 % vaginal gel  2 times daily     02/25/18 1456       Jonnell Hentges,  Jeanette Caprice, PA-C 02/25/18 1511    Azalia Bilis, MD 02/25/18 [phone]

## 2018-02-25 NOTE — ED Triage Notes (Signed)
Per Pt: Pt reports she was on the depot shot and is now off the depot shot. Pt reports a vaginal odor that is bothering her. Pt reports she used the cream she was given from the hospital and the smell went away, but then she had sex again and noticed the odor returned. Pt reports no vaginal discharge and no itching or swelling to the area. Pt reports she only has 1 sexual partner.

## 2018-02-26 LAB — GC/CHLAMYDIA PROBE AMP (~~LOC~~) NOT AT ARMC
CHLAMYDIA, DNA PROBE: NEGATIVE
Neisseria Gonorrhea: NEGATIVE

## 2018-03-04 ENCOUNTER — Ambulatory Visit: Payer: Medicaid Other

## 2018-03-06 ENCOUNTER — Ambulatory Visit: Payer: Medicaid Other

## 2018-03-06 ENCOUNTER — Other Ambulatory Visit (HOSPITAL_COMMUNITY)
Admission: RE | Admit: 2018-03-06 | Discharge: 2018-03-06 | Disposition: A | Discharge status: Home / Self Care | Payer: Medicaid Other | Source: Ambulatory Visit | Attending: Nurse Practitioner | Admitting: Nurse Practitioner

## 2018-03-06 ENCOUNTER — Ambulatory Visit (INDEPENDENT_AMBULATORY_CARE_PROVIDER_SITE_OTHER): Payer: Medicaid Other | Admitting: Nurse Practitioner

## 2018-03-06 ENCOUNTER — Encounter: Payer: Self-pay | Admitting: Nurse Practitioner

## 2018-03-06 VITALS — BP 99/71 | HR 88 | Wt 128.8 lb

## 2018-03-06 DIAGNOSIS — Z3042 Encounter for surveillance of injectable contraceptive: Secondary | ICD-10-CM | POA: Diagnosis not present

## 2018-03-06 DIAGNOSIS — Z0000 Encounter for general adult medical examination without abnormal findings: Secondary | ICD-10-CM

## 2018-03-06 DIAGNOSIS — Z01419 Encounter for gynecological examination (general) (routine) without abnormal findings: Secondary | ICD-10-CM

## 2018-03-06 MED ORDER — MEDROXYPROGESTERONE ACETATE 150 MG/ML IM SUSP
150.0000 mg | Freq: Once | INTRAMUSCULAR | Status: AC
  Administered 2018-03-06: 150 mg via INTRAMUSCULAR

## 2018-03-06 NOTE — Progress Notes (Signed)
GYNECOLOGY ANNUAL PREVENTATIVE CARE ENCOUNTER NOTE  Subjective:   Heather Whitaker is a 22 y.o. G74P1001 female here for a routine annual gynecologic exam.  Current complaints: none.   Denies abnormal vaginal bleeding, discharge, pelvic pain, problems with intercourse or other gynecologic concerns.    Gynecologic History Patient's last menstrual period was 02/20/2018 (approximate). Contraception: Depo-Provera injections Last Pap: Has never had - will do today.  Declined pap at postpartum visit.  Needs annual visit today to continue Depo.  Obstetric History OB History  Gravida Para Term Preterm AB Living  1 1 1  0 0 1  SAB TAB Ectopic Multiple Live Births  0 0 0 0 1    # Outcome Date GA Lbr Len/2nd Weight Sex Delivery Anes PTL Lv  1 Term 05/15/17 [redacted]w[redacted]d 01:41 / 00:33 7 lb 1.8 oz (3.226 kg) M Vag-Spont None  LIV    Past Medical History:  Diagnosis Date  . Back pain affecting pregnancy in third trimester 03/26/2017  . Irregular uterine contractions 03/26/2017  . Medical history non-contributory     Past Surgical History:  Procedure Laterality Date  . MYRINGOTOMY WITH TUBE PLACEMENT      Current Outpatient Medications on File Prior to Visit  Medication Sig Dispense Refill  . medroxyPROGESTERone (DEPO-PROVERA) 150 MG/ML injection Inject 150 mg into the muscle every 3 (three) months.    . metroNIDAZOLE (METROGEL VAGINAL) 0.75 % vaginal gel Place 1 Applicatorful vaginally 2 (two) times daily. 70 g 0   No current facility-administered medications on file prior to visit.     No Known Allergies  Social History   Socioeconomic History  . Marital status: Single    Spouse name: Not on file  . Number of children: Not on file  . Years of education: Not on file  . Highest education level: Not on file  Occupational History  . Not on file  Social Needs  . Financial resource strain: Not on file  . Food insecurity:    Worry: Not on file    Inability: Not on file  . Transportation  needs:    Medical: Not on file    Non-medical: Not on file  Tobacco Use  . Smoking status: Never Smoker  . Smokeless tobacco: Never Used  Substance and Sexual Activity  . Alcohol use: No  . Drug use: No  . Sexual activity: Yes    Birth control/protection: None  Lifestyle  . Physical activity:    Days per week: Not on file    Minutes per session: Not on file  . Stress: Not on file  Relationships  . Social connections:    Talks on phone: Not on file    Gets together: Not on file    Attends religious service: Not on file    Active member of club or organization: Not on file    Attends meetings of clubs or organizations: Not on file    Relationship status: Not on file  . Intimate partner violence:    Fear of current or ex partner: Not on file    Emotionally abused: Not on file    Physically abused: Not on file    Forced sexual activity: Not on file  Other Topics Concern  . Not on file  Social History Narrative  . Not on file    Family History  Problem Relation Age of Onset  . Hypertension Maternal Grandfather   . Asthma Brother   . Cancer Neg Hx   . Diabetes Neg  Hx     The following portions of the patient's history were reviewed and updated as appropriate: allergies, current medications, past family history, past medical history, past social history, past surgical history and problem list.  Review of Systems Pertinent items noted in HPI and remainder of comprehensive ROS otherwise negative.   Objective:  BP 99/71   Pulse 88   Wt 128 lb 12.8 oz (58.4 kg)   LMP 02/20/2018 (Approximate)   BMI 21.11 kg/m  CONSTITUTIONAL: Well-developed, well-nourished female in no acute distress.  HENT:  Normocephalic, atraumatic, External right and left ear normal.  EYES: Conjunctivae and EOM are normal. No scleral icterus.  NECK: Normal range of motion, supple, no masses.  Normal thyroid.  SKIN: Skin is warm and dry. No rash noted. Not diaphoretic. No erythema. No  pallor. NEUROLOGIC: Alert and oriented to person, place, and time. Normal muscle tone coordination. No cranial nerve deficit noted. PSYCHIATRIC: Normal mood and affect. Normal behavior. Normal judgment and thought content. CARDIOVASCULAR: Normal heart rate noted, regular rhythm RESPIRATORY: Clear to auscultation bilaterally. Effort and breath sounds normal, no problems with respiration noted. BREASTS: Symmetric in size. No masses, skin changes, nipple drainage, or lymphadenopathy.  Bilateral nipple piercing with rods. ABDOMEN: Soft, no distention noted.  No tenderness, rebound or guarding.  PELVIC: Normal appearing external genitalia; normal appearing vaginal mucosa and cervix.  Some clumpy discharge noted - last used Metrogel 2 days ago.  Pap smear obtained.  Normal uterine size, no other palpable masses, no uterine or adnexal tenderness. MUSCULOSKELETAL: Normal range of motion. No tenderness.  No cyanosis, clubbing, or edema.    Assessment and Plan:  1. Encounter for annual routine gynecological examination Advised client that likely yeast infection is clearing and discharge is from recent use of Metrogel.  If symptoms recur within 2 weeks, would call in another round of Terazol if needed.  If it is at a later date, will need to come in for reevaluation.  2. Surveillance for Depo-Provera contraception Depo given today and to return in 3 months for injection.  Will follow up results of pap smear and manage accordingly. Routine preventative health maintenance measures emphasized. Please refer to After Visit Summary for other counseling recommendations.    Nolene BernheimERRI BURLESON, RN, MSN, NP-BC Nurse Practitioner, [facility]

## 2018-03-06 NOTE — Patient Instructions (Addendum)
Condoms always for protection from sexually transmitted infections. Return for your Depo. Wait at least one year before getting pregnant again. You are at a healthy weight right now.

## 2018-03-09 LAB — CYTOLOGY - PAP
CHLAMYDIA, DNA PROBE: NEGATIVE
DIAGNOSIS: NEGATIVE
Neisseria Gonorrhea: NEGATIVE

## 2018-05-27 ENCOUNTER — Ambulatory Visit: Payer: Medicaid Other

## 2018-06-17 ENCOUNTER — Ambulatory Visit (INDEPENDENT_AMBULATORY_CARE_PROVIDER_SITE_OTHER): Payer: Self-pay | Admitting: General Practice

## 2018-06-17 DIAGNOSIS — Z3042 Encounter for surveillance of injectable contraceptive: Secondary | ICD-10-CM

## 2018-06-17 NOTE — Progress Notes (Signed)
I have reviewed the chart and agree with nursing staff's documentation of this patient's encounter.  Raelyn MoraRolitta Kerby Hockley, CNM 06/17/2018 10:00 AM

## 2018-06-17 NOTE — Progress Notes (Signed)
Heather Whitaker here for Depo-Provera  Injection. Last Depo was administered 03/06/18- patient is almost 2 weeks late for injection. Patient reports unprotected intercourse last week. Discussed she will need to come back in 2 weeks to obtain a negative UPT and should avoid intercourse/have protected intercourse between now & then. Patient verbalized understanding & had no questions.

## 2018-07-01 ENCOUNTER — Ambulatory Visit: Payer: Medicaid Other

## 2018-07-08 ENCOUNTER — Ambulatory Visit (INDEPENDENT_AMBULATORY_CARE_PROVIDER_SITE_OTHER): Payer: Medicaid Other

## 2018-07-08 VITALS — Wt 130.8 lb

## 2018-07-08 DIAGNOSIS — Z3042 Encounter for surveillance of injectable contraceptive: Secondary | ICD-10-CM | POA: Diagnosis present

## 2018-07-08 MED ORDER — MEDROXYPROGESTERONE ACETATE 150 MG/ML IM SUSP
150.0000 mg | Freq: Once | INTRAMUSCULAR | Status: AC
  Administered 2018-07-08: 150 mg via INTRAMUSCULAR

## 2018-07-08 NOTE — Progress Notes (Addendum)
Heather Whitaker here for Depo-Provera  Injection.  Injection administered without complication. Patient will return in 3 months for next injection.  [name], CMA 07/08/2018  10:37 AM  I have reviewed and I am in agreement with the note and plan of care.  Nolene BernheimERRI BURLESON, RN, MSN, NP-BC Nurse Practitioner, [facility], [facility] 07/08/2018 12:46 PM

## 2018-07-08 NOTE — Addendum Note (Signed)
Addended by: Henrietta DineNEAL, PAMELA S on: 07/08/2018 03:59 PM   Modules accepted: Orders

## 2018-09-23 ENCOUNTER — Ambulatory Visit: Payer: Medicaid Other

## 2018-11-17 ENCOUNTER — Ambulatory Visit (INDEPENDENT_AMBULATORY_CARE_PROVIDER_SITE_OTHER): Payer: Medicaid Other | Admitting: General Practice

## 2018-11-17 VITALS — BP 107/76 | HR 76 | Ht 65.0 in | Wt 129.0 lb

## 2018-11-17 DIAGNOSIS — Z3042 Encounter for surveillance of injectable contraceptive: Secondary | ICD-10-CM

## 2018-11-17 DIAGNOSIS — Z3202 Encounter for pregnancy test, result negative: Secondary | ICD-10-CM | POA: Diagnosis not present

## 2018-11-17 MED ORDER — MEDROXYPROGESTERONE ACETATE 150 MG/ML IM SUSP
150.0000 mg | Freq: Once | INTRAMUSCULAR | Status: AC
  Administered 2018-11-17: 150 mg via INTRAMUSCULAR

## 2018-11-17 NOTE — Progress Notes (Signed)
Chart reviewed for nurse visit. Agree with plan of care.   Marylene Land, CNM 11/17/2018 3:24 PM

## 2018-11-17 NOTE — Progress Notes (Signed)
Jemma Forinash here for Depo-Provera  Injection. Patient was late for injection today, last injection was due by November 27th. Patient denies recent unprotected intercourse. UPT -. Injection administered without complication. Patient will return in 3 months for next injection. Recommended considering alternative options for birth control since this is her second injection she is late for.  Marylynn Pearson, RN 11/17/2018  9:08 AM

## 2019-02-03 ENCOUNTER — Other Ambulatory Visit: Payer: Self-pay

## 2019-02-03 ENCOUNTER — Ambulatory Visit (INDEPENDENT_AMBULATORY_CARE_PROVIDER_SITE_OTHER): Payer: Medicaid Other

## 2019-02-03 ENCOUNTER — Ambulatory Visit: Payer: Medicaid Other

## 2019-02-03 DIAGNOSIS — Z3042 Encounter for surveillance of injectable contraceptive: Secondary | ICD-10-CM

## 2019-02-03 MED ORDER — MEDROXYPROGESTERONE ACETATE 150 MG/ML IM SUSP
150.0000 mg | Freq: Once | INTRAMUSCULAR | Status: AC
  Administered 2019-02-03: 150 mg via INTRAMUSCULAR

## 2019-02-03 NOTE — Progress Notes (Addendum)
Tatiana Woolever here for Depo-Provera  Injection.  Injection administered without complication. Patient will return in 3 months for next injection.    Ralene Bathe, RN 02/03/2019  1:20 PM   I have reviewed the note and agree with the plan of care.  [name], RN, MSN, NP-BC Nurse Practitioner, [facility] for [employer], [facility] 02/03/2019 5:21 PM

## 2019-04-21 ENCOUNTER — Ambulatory Visit (INDEPENDENT_AMBULATORY_CARE_PROVIDER_SITE_OTHER): Payer: Medicaid Other

## 2019-04-21 ENCOUNTER — Other Ambulatory Visit: Payer: Self-pay

## 2019-04-21 DIAGNOSIS — Z3042 Encounter for surveillance of injectable contraceptive: Secondary | ICD-10-CM | POA: Diagnosis not present

## 2019-04-21 MED ORDER — MEDROXYPROGESTERONE ACETATE 150 MG/ML IM SUSP
150.0000 mg | Freq: Once | INTRAMUSCULAR | Status: AC
  Administered 2019-04-21: 150 mg via INTRAMUSCULAR

## 2019-04-21 NOTE — Progress Notes (Signed)
Heather Whitaker here for Depo-Provera  Injection.  Injection administered without complication. Patient will return in 3 months for next injection.  Mel Almond, RN 04/21/2019  10:01 AM

## 2019-05-03 NOTE — Progress Notes (Signed)
Patient ID: Heather Whitaker, female   DOB: 08-08-1996, 23 y.o.   MRN: [id number] I have reviewed the chart and agree with nursing staff's documentation of this patient's encounter.  Emeterio Reeve, MD 05/03/2019 10:12 PM

## 2019-07-07 ENCOUNTER — Other Ambulatory Visit: Payer: Self-pay

## 2019-07-07 ENCOUNTER — Ambulatory Visit (INDEPENDENT_AMBULATORY_CARE_PROVIDER_SITE_OTHER): Payer: Medicaid Other | Admitting: *Deleted

## 2019-07-07 VITALS — BP 109/59 | HR 81 | Ht 65.5 in | Wt 143.6 lb

## 2019-07-07 DIAGNOSIS — Z3042 Encounter for surveillance of injectable contraceptive: Secondary | ICD-10-CM

## 2019-07-07 MED ORDER — MEDROXYPROGESTERONE ACETATE 150 MG/ML IM SUSP
150.0000 mg | Freq: Once | INTRAMUSCULAR | Status: AC
  Administered 2019-07-07: 14:00:00 150 mg via INTRAMUSCULAR

## 2019-07-07 NOTE — Progress Notes (Signed)
Patient seen and assessed by nursing staff during this encounter. I have reviewed the chart and agree with the documentation and plan.  Aletha Halim, MD 07/07/2019 3:19 PM

## 2019-07-07 NOTE — Progress Notes (Signed)
Depo Provera 150 mg IM administered as scheduled. Pt tolerated well. Pt advised she will need Annual Gyn exam along with next injection due 11/11 - 11/25. She voiced understanding.

## 2019-08-31 ENCOUNTER — Ambulatory Visit: Payer: Medicaid Other | Admitting: Nurse Practitioner

## 2019-08-31 ENCOUNTER — Other Ambulatory Visit (HOSPITAL_COMMUNITY)
Admission: RE | Admit: 2019-08-31 | Discharge: 2019-08-31 | Disposition: A | Discharge status: Home / Self Care | Payer: Medicaid Other | Source: Ambulatory Visit | Attending: Nurse Practitioner | Admitting: Nurse Practitioner

## 2019-08-31 ENCOUNTER — Encounter: Payer: Self-pay | Admitting: Nurse Practitioner

## 2019-08-31 ENCOUNTER — Other Ambulatory Visit: Payer: Self-pay

## 2019-08-31 VITALS — BP 134/80 | Wt 140.0 lb

## 2019-08-31 DIAGNOSIS — Z3042 Encounter for surveillance of injectable contraceptive: Secondary | ICD-10-CM | POA: Insufficient documentation

## 2019-08-31 DIAGNOSIS — Z23 Encounter for immunization: Secondary | ICD-10-CM

## 2019-08-31 DIAGNOSIS — Z0000 Encounter for general adult medical examination without abnormal findings: Secondary | ICD-10-CM

## 2019-08-31 MED ORDER — MEGESTROL ACETATE 40 MG PO TABS: ORAL_TABLET | ORAL | 1 refills | Status: DC

## 2019-08-31 NOTE — Progress Notes (Signed)
GYNECOLOGY ANNUAL PREVENTATIVE CARE ENCOUNTER NOTE  Subjective:   Heather Whitaker is a 23 y.o. G62P1001 female here for a routine annual gynecologic exam.  Current complaints: vaginal bleeding for one week.  No abdominal pain and no dysuria.   Denies abnormal  discharge, pelvic pain, problems with intercourse or other gynecologic concerns.    Gynecologic History No LMP recorded. Patient has had an injection. Contraception: Depo-Provera injections  Not given today but has appointment for next injection Last Pap: 02-2018. Results were: normal   Obstetric History OB History  Gravida Para Term Preterm AB Living  1 1 1  0 0 1  SAB TAB Ectopic Multiple Live Births  0 0 0 0 1    # Outcome Date GA Lbr Len/2nd Weight Sex Delivery Anes PTL Lv  1 Term 05/15/17 [redacted]w[redacted]d 01:41 / 00:33 7 lb 1.8 oz (3.226 kg) M Vag-Spont None  LIV    Past Medical History:  Diagnosis Date  . Back pain affecting pregnancy in third trimester 03/26/2017  . Irregular uterine contractions 03/26/2017  . Medical history non-contributory     Past Surgical History:  Procedure Laterality Date  . MYRINGOTOMY WITH TUBE PLACEMENT      Current Outpatient Medications on File Prior to Visit  Medication Sig Dispense Refill  . medroxyPROGESTERone (DEPO-PROVERA) 150 MG/ML injection Inject 150 mg into the muscle every 3 (three) months.     No current facility-administered medications on file prior to visit.     No Known Allergies  Social History   Socioeconomic History  . Marital status: Single    Spouse name: Not on file  . Number of children: Not on file  . Years of education: Not on file  . Highest education level: Not on file  Occupational History  . Not on file  Social Needs  . Financial resource strain: Not on file  . Food insecurity    Worry: Not on file    Inability: Not on file  . Transportation needs    Medical: Not on file    Non-medical: Not on file  Tobacco Use  . Smoking status: Never Smoker  .  Smokeless tobacco: Never Used  Substance and Sexual Activity  . Alcohol use: No  . Drug use: No  . Sexual activity: Yes    Birth control/protection: None  Lifestyle  . Physical activity    Days per week: Not on file    Minutes per session: Not on file  . Stress: Not on file  Relationships  . Social Herbalist on phone: Not on file    Gets together: Not on file    Attends religious service: Not on file    Active member of club or organization: Not on file    Attends meetings of clubs or organizations: Not on file    Relationship status: Not on file  . Intimate partner violence    Fear of current or ex partner: Not on file    Emotionally abused: Not on file    Physically abused: Not on file    Forced sexual activity: Not on file  Other Topics Concern  . Not on file  Social History Narrative  . Not on file    Family History  Problem Relation Age of Onset  . Hypertension Maternal Grandfather   . Asthma Brother   . Cancer Neg Hx   . Diabetes Neg Hx     The following portions of the patient's history were reviewed and  updated as appropriate: allergies, current medications, past family history, past medical history, past social history, past surgical history and problem list.  Review of Systems Pertinent items noted in HPI and remainder of comprehensive ROS otherwise negative.   Objective:  Wt 140 lb (63.5 kg)   BMI 22.94 kg/m  CONSTITUTIONAL: Well-developed, well-nourished female in no acute distress.  HENT:  Normocephalic, atraumatic, External right and left ear normal.  EYES: Conjunctivae and EOM are normal. Pupils are equal, round.  No scleral icterus.  NECK: Normal range of motion, supple, no masses.  Normal thyroid.  SKIN: Skin is warm and dry. No rash noted. Not diaphoretic. No erythema. No pallor. NEUROLOGIC: Alert and oriented to person, place, and time. Normal reflexes, muscle tone coordination. No cranial nerve deficit noted. PSYCHIATRIC: Normal mood  and affect. Normal behavior. Normal judgment and thought content. CARDIOVASCULAR: Normal heart rate noted, regular rhythm RESPIRATORY: Clear to auscultation bilaterally. Effort and breath sounds normal, no problems with respiration noted. BREASTS: not indicated ABDOMEN: Soft, no distention noted.  No tenderness, rebound or guarding.  MUSCULOSKELETAL: Normal range of motion. No tenderness.  No cyanosis, clubbing, or edema.   Self swab done.  Assessment and Plan:  1. Annual physical exam  - Flu Vaccine QUAD 36+ mos IM - Tdap vaccine greater than or equal to 7yo IM - Cervicovaginal ancillary only( [place])  2. Surveillance for Depo-Provera contraception On depo for one year - no weight gain - doing well. Having some vaginal bleeding for one week.  Wondering if it is from the Depo although she has not had bleeding before. Will try Megace and see if her bleeding will stop. Also checking self swab for GC/Chlam  Routine preventative health maintenance measures emphasized. Please refer to After Visit Summary for other counseling recommendations.    [name], RN, MSN, NP-BC Nurse Practitioner, [facility] for [facility]

## 2019-08-31 NOTE — Progress Notes (Signed)
Last Pap donr 03/06/2018 and  Resulted on 03/09/2018 Normal results

## 2019-09-09 ENCOUNTER — Telehealth (HOSPITAL_COMMUNITY): Payer: Self-pay | Admitting: Lactation Services

## 2019-09-09 LAB — CERVICOVAGINAL ANCILLARY ONLY
Bacterial Vaginitis (gardnerella): POSITIVE — AB
Candida Glabrata: NEGATIVE
Candida Vaginitis: POSITIVE — AB
Chlamydia: POSITIVE — AB
Comment: NEGATIVE
Comment: NEGATIVE
Comment: NEGATIVE
Comment: NEGATIVE
Comment: NEGATIVE
Comment: NORMAL
Neisseria Gonorrhea: NEGATIVE
Trichomonas: NEGATIVE

## 2019-09-09 NOTE — Telephone Encounter (Signed)
Called Cytology to inquire about unresulted Aptima Swab. Cytology reports they were out of supplies and are behind on completing this results. Cytology is working on getting processed.

## 2019-09-10 ENCOUNTER — Telehealth: Payer: Self-pay | Admitting: *Deleted

## 2019-09-10 ENCOUNTER — Encounter: Payer: Self-pay | Admitting: *Deleted

## 2019-09-10 MED ORDER — FLUCONAZOLE 150 MG PO TABS: ORAL_TABLET | ORAL | 0 refills | Status: DC

## 2019-09-10 MED ORDER — TERCONAZOLE 0.4 % VA CREA: 1.0000 | TOPICAL_CREAM | Freq: Every day | VAGINAL | 0 refills | Status: AC

## 2019-09-10 MED ORDER — AZITHROMYCIN 250 MG PO TABS: 1000.0000 mg | ORAL_TABLET | Freq: Once | ORAL | 0 refills | Status: AC

## 2019-09-10 MED ORDER — METRONIDAZOLE 500 MG PO TABS: 500.0000 mg | ORAL_TABLET | Freq: Two times a day (BID) | ORAL | 0 refills | Status: DC

## 2019-09-10 NOTE — Telephone Encounter (Signed)
STD form completed and sent to the health department. Linda,RN

## 2019-09-10 NOTE — Addendum Note (Signed)
Addended by: Virginia Rochester on: 09/10/2019 07:57 AM   Modules accepted: Orders

## 2019-09-10 NOTE — Progress Notes (Signed)
Lab results back - chlamydia, BV and yeast.  MyChart message sent to client and medications sent to her pharmacy.  Informed her chlamydia is a STI and to inform her partners so they can be treated.  Advised no sex in 7 days after medication so infection can clear.  Message sent to clinical staff so chlamydia can be reported to the health department.  Earlie Server, RN, MSN, NP-BC Nurse Practitioner, [facility] for [employer], [place] [facility] 09/10/2019 7:56 AM

## 2019-09-10 NOTE — Telephone Encounter (Signed)
-----   Message from Virginia Rochester, NP sent at 09/10/2019  7:56 AM EDT ----- Chlamydia, BV, and yeast noted on lab result.  Message sent to clinical staff to report chlamydia to the Health Department.  MyChart message sent to client about her results and medications sent to her pharmacy.

## 2019-09-23 ENCOUNTER — Ambulatory Visit: Payer: Medicaid Other

## 2019-11-11 ENCOUNTER — Encounter

## 2019-12-20 ENCOUNTER — Other Ambulatory Visit: Payer: Self-pay

## 2019-12-20 ENCOUNTER — Ambulatory Visit (INDEPENDENT_AMBULATORY_CARE_PROVIDER_SITE_OTHER): Payer: Self-pay

## 2019-12-20 DIAGNOSIS — Z202 Contact with and (suspected) exposure to infections with a predominantly sexual mode of transmission: Secondary | ICD-10-CM

## 2019-12-20 NOTE — Progress Notes (Signed)
Pt here today requesting self-swab for STI testing. Pt expresses concern for possible exposure. Per chart review pt has [employer]. Explained to pt she will have a cost associated with self-swab at our office. Offered information about free STI testing sites in [place]. [facility] called with pt; no one available to take our call at this time. Pt states she will call tomorrow morning to set up an appt time. Pt encouraged to reach back out to our office if she is unable to reach the health department.  Fleet Contras RN 12/20/19

## 2019-12-21 NOTE — Progress Notes (Signed)
Chart reviewed - agree with CMA/RN documentation.  ° °

## 2020-01-31 ENCOUNTER — Ambulatory Visit: Payer: Medicaid Other

## 2020-02-02 ENCOUNTER — Encounter (HOSPITAL_COMMUNITY): Payer: Self-pay | Admitting: Emergency Medicine

## 2020-02-02 ENCOUNTER — Emergency Department (HOSPITAL_COMMUNITY)
Admission: EM | Admit: 2020-02-02 | Discharge: 2020-02-02 | Disposition: A | Discharge status: Home / Self Care | Payer: Self-pay | Attending: Emergency Medicine | Admitting: Emergency Medicine

## 2020-02-02 ENCOUNTER — Other Ambulatory Visit: Payer: Self-pay

## 2020-02-02 ENCOUNTER — Emergency Department (HOSPITAL_COMMUNITY): Payer: Self-pay

## 2020-02-02 DIAGNOSIS — W109XXA Fall (on) (from) unspecified stairs and steps, initial encounter: Secondary | ICD-10-CM | POA: Insufficient documentation

## 2020-02-02 DIAGNOSIS — S93491A Sprain of other ligament of right ankle, initial encounter: Secondary | ICD-10-CM | POA: Insufficient documentation

## 2020-02-02 DIAGNOSIS — Y929 Unspecified place or not applicable: Secondary | ICD-10-CM | POA: Insufficient documentation

## 2020-02-02 DIAGNOSIS — Y999 Unspecified external cause status: Secondary | ICD-10-CM | POA: Insufficient documentation

## 2020-02-02 DIAGNOSIS — Y9301 Activity, walking, marching and hiking: Secondary | ICD-10-CM | POA: Insufficient documentation

## 2020-02-02 MED ORDER — IBUPROFEN 200 MG PO TABS
600.0000 mg | ORAL_TABLET | Freq: Once | ORAL | Status: AC
  Administered 2020-02-02: 13:00:00 600 mg via ORAL
  Filled 2020-02-02: qty 3

## 2020-02-02 MED ORDER — IBUPROFEN 600 MG PO TABS: 600.0000 mg | ORAL_TABLET | Freq: Four times a day (QID) | ORAL | 0 refills | Status: DC | PRN

## 2020-02-02 NOTE — ED Triage Notes (Signed)
The patient fell down her steps last night and injured her right ankle. She had pain last night, but after a moment of rest she was able to ambulate. This morning she woke up with continued pain swelling in her right ankle. In the past when she has ankle injuries, the pain would not linger as long, so she decided to get it checked today.

## 2020-02-02 NOTE — Discharge Instructions (Signed)
Your x-ray shows no evidence of fracture, you have an ankle sprain, wear brace provided and use crutches to help stay off of the ankle, ice and elevate.  Use ibuprofen and Tylenol for pain.  Follow-up with sports medicine if not improving in 1 week.

## 2020-02-02 NOTE — ED Provider Notes (Signed)
[facility] DEPT Provider Note   CSN: [id number] Arrival date & time: 02/02/20  1143     History Chief Complaint  Patient presents with  . Ankle Pain    Heather Whitaker is a 24 y.o. female.  Heather Whitaker is a 24 y.o. female who is otherwise healthy, presents to the ED for evaluation of injury to her right ankle.  She states she was walking down the stairs last night and stepped on her son's phone football and this caused her to roll her ankle.  She reports pain is localized to the lateral aspect of the ankle, last night was mild but today is more severe and she has been walking with a limp.  She reports slight swelling in this area, no bruising or discoloration.  No pain in the foot, shin or knee.  No pain medicine prior to arrival.  No previous injury or surgery to the ankle.        Past Medical History:  Diagnosis Date  . Back pain affecting pregnancy in third trimester 03/26/2017  . Irregular uterine contractions 03/26/2017  . Medical history non-contributory     Patient Active Problem List   Diagnosis Date Noted  . Surveillance for Depo-Provera contraception 08/31/2019    Past Surgical History:  Procedure Laterality Date  . MYRINGOTOMY WITH TUBE PLACEMENT       OB History    Gravida  1   Para  1   Term  1   Preterm  0   AB  0   Living  1     SAB  0   TAB  0   Ectopic  0   Multiple  0   Live Births  1           Family History  Problem Relation Age of Onset  . Hypertension Maternal Grandfather   . Asthma Brother   . Cancer Neg Hx   . Diabetes Neg Hx     Social History   Tobacco Use  . Smoking status: Never Smoker  . Smokeless tobacco: Never Used  Substance Use Topics  . Alcohol use: No  . Drug use: No    Home Medications Prior to Admission medications   Medication Sig Start Date End Date Taking? Authorizing Provider  fluconazole (DIFLUCAN) 150 MG tablet Take one tablet by mouth on Day 1 and one tablet  on Day 4 09/10/19   Burleson, Terri L, NP  ibuprofen (ADVIL) 600 MG tablet Take 1 tablet (600 mg total) by mouth every 6 (six) hours as needed. 02/02/20   Jacqlyn Larsen, PA-C  medroxyPROGESTERone (DEPO-PROVERA) 150 MG/ML injection Inject 150 mg into the muscle every 3 (three) months.    [provider]  megestrol (MEGACE) 40 MG tablet Take one morning and night until the bleeding stops, then taper to once a day for 3 days. 08/31/19   Burleson, Rona Ravens, NP  metroNIDAZOLE (FLAGYL) 500 MG tablet Take 1 tablet (500 mg total) by mouth 2 (two) times daily. No alcohol while taking this medication 09/10/19   Virginia Rochester, NP    Allergies    Patient has no known allergies.  Review of Systems   Review of Systems  Constitutional: Negative for chills and fever.  Musculoskeletal: Positive for arthralgias and joint swelling.  Skin: Negative for color change and rash.  Neurological: Negative for weakness and numbness.    Physical Exam Updated Vital Signs BP 113/72   Pulse 70   Temp  99 F (37.2 C) (Oral)   Resp 18   Ht 5\' 5"  (1.651 m)   Wt 63.5 kg   LMP 01/07/2020 (Approximate)   SpO2 99%   BMI 23.30 kg/m   Physical Exam Vitals and nursing note reviewed.  Constitutional:      General: She is not in acute distress.    Appearance: Normal appearance. She is well-developed. She is not ill-appearing or diaphoretic.  HENT:     Head: Normocephalic and atraumatic.  Eyes:     General:        Right eye: No discharge.        Left eye: No discharge.  Pulmonary:     Effort: Pulmonary effort is normal. No respiratory distress.  Musculoskeletal:     Comments: There is swelling and tenderness over the lateral malleolus.No overt deformity. No tenderness over the medial aspect of the ankle. The fifth metatarsal is not tender. The ankle joint is intact without excessive opening on stressing.  2+ DP pulses, good cap refill.  Normal strength and sensation.  Skin:    General: Skin is warm and  dry.  Neurological:     Mental Status: She is alert and oriented to person, place, and time.     Coordination: Coordination normal.  Psychiatric:        Behavior: Behavior normal.     ED Results / Procedures / Treatments   Labs (all labs ordered are listed, but only abnormal results are displayed) Labs Reviewed - No data to display  EKG None  Radiology DG Ankle Complete Right  Result Date: 02/02/2020 CLINICAL DATA:  Right ankle pain after fall last night. EXAM: RIGHT ANKLE - COMPLETE 3+ VIEW COMPARISON:  None. FINDINGS: No acute fracture or dislocation. The ankle mortise is symmetric. The talar dome is intact. No tibiotalar joint effusion. Joint spaces are preserved. Bone mineralization is normal. Soft tissues are unremarkable. IMPRESSION: Negative. Electronically Signed   By: 02/04/2020 M.D.   On: 02/02/2020 12:38    Procedures Procedures (including critical care time)  Medications Ordered in ED Medications  ibuprofen (ADVIL) tablet 600 mg (has no administration in time range)    ED Course  I have reviewed the triage vital signs and the nursing notes.  Pertinent labs & imaging results that were available during my care of the patient were reviewed by me and considered in my medical decision making (see chart for details).    MDM Rules/Calculators/A&P                      Presentation consistent with ankle sprain. Tenderness and swelling over lateral malleolus, pt is neurovascularly intact, and x-ray negative for fracture, and shows ankle mortise is intact. Pain treated in the ED. Pt placed in ASO brace and provided crutches, ambulated without difficulty. Pt stable for discharge home with ibuprofen for pain. Pt to follow-up with ortho in one week if symptoms not improving. Return precautions discussed, Pt expresses understanding and agrees with plan.  Final Clinical Impression(s) / ED Diagnoses Final diagnoses:  Sprain of anterior talofibular ligament of right ankle,  initial encounter    Rx / DC Orders ED Discharge Orders         Ordered    ibuprofen (ADVIL) 600 MG tablet  Every 6 hours PRN     02/02/20 1309           02/04/20, Dartha Lodge 02/02/20 1313    02/04/20, MD 02/05/20 1209

## 2020-03-08 ENCOUNTER — Ambulatory Visit (INDEPENDENT_AMBULATORY_CARE_PROVIDER_SITE_OTHER): Payer: Medicaid Other | Admitting: *Deleted

## 2020-03-08 ENCOUNTER — Ambulatory Visit: Payer: Medicaid Other

## 2020-03-08 ENCOUNTER — Other Ambulatory Visit: Payer: Self-pay

## 2020-03-08 DIAGNOSIS — Z3042 Encounter for surveillance of injectable contraceptive: Secondary | ICD-10-CM

## 2020-03-08 DIAGNOSIS — N898 Other specified noninflammatory disorders of vagina: Secondary | ICD-10-CM

## 2020-03-08 LAB — POCT PREGNANCY, URINE: Preg Test, Ur: NEGATIVE

## 2020-03-08 MED ORDER — MEDROXYPROGESTERONE ACETATE 150 MG/ML IM SUSP
150.0000 mg | Freq: Once | INTRAMUSCULAR | Status: AC
  Administered 2020-03-08: 14:00:00 150 mg via INTRAMUSCULAR

## 2020-03-08 NOTE — Progress Notes (Signed)
Pt here for re-start of Depo Provera for birth control. She reports LMP 02/15/20. Last unprotected sex was on 02/25/20. Pregnancy test is negative today. Per consult w/Heather hogan, pt may have Depo injection. This was administered and pt tolerated well. Pt stated she has vaginal irritation and itching x2-3 weeks. She desires testing for BV, yeast and STI. I advised pt that her [facility] will not cover that test and she will have a charge. She may have the test @ [facility] for free. Pt then stated that she will call them tomorrow. Pt informed that she will be due for Annual Gyn exam in October. Pt voiced understanding of all information and instructions given.

## 2020-03-16 NOTE — Progress Notes (Signed)
Patient seen and assessed by nursing staff during this encounter. I have reviewed the chart and agree with the documentation and plan. I have also made any necessary editorial changes.  Thressa Sheller DNP, CNM  03/16/20  12:43 PM

## 2020-05-24 ENCOUNTER — Ambulatory Visit: Payer: Medicaid Other

## 2020-06-02 ENCOUNTER — Encounter (HOSPITAL_COMMUNITY): Payer: Self-pay | Admitting: Obstetrics and Gynecology

## 2020-06-02 ENCOUNTER — Other Ambulatory Visit: Payer: Self-pay

## 2020-06-02 ENCOUNTER — Emergency Department (HOSPITAL_COMMUNITY)
Admission: EM | Admit: 2020-06-02 | Discharge: 2020-06-02 | Disposition: A | Discharge status: Home / Self Care | Payer: Medicaid Other | Attending: Emergency Medicine | Admitting: Emergency Medicine

## 2020-06-02 DIAGNOSIS — Z5321 Procedure and treatment not carried out due to patient leaving prior to being seen by health care provider: Secondary | ICD-10-CM | POA: Insufficient documentation

## 2020-06-02 DIAGNOSIS — R1030 Lower abdominal pain, unspecified: Secondary | ICD-10-CM | POA: Insufficient documentation

## 2020-06-02 MED ORDER — SODIUM CHLORIDE 0.9% FLUSH: 3.0000 mL | Freq: Once | INTRAVENOUS | Status: DC

## 2020-06-02 NOTE — ED Triage Notes (Signed)
Patient reports to the ER for lower abdominal pain. Patient denies N/V/D. Patient reports she is on birth control and recently had a period. Patient denies concerns for STD. Patient reports pain x1 week.

## 2020-06-20 ENCOUNTER — Ambulatory Visit (INDEPENDENT_AMBULATORY_CARE_PROVIDER_SITE_OTHER): Payer: Medicaid Other

## 2020-06-20 ENCOUNTER — Other Ambulatory Visit: Payer: Self-pay

## 2020-06-20 VITALS — BP 109/73 | HR 78 | Wt 141.9 lb

## 2020-06-20 DIAGNOSIS — Z3042 Encounter for surveillance of injectable contraceptive: Secondary | ICD-10-CM | POA: Diagnosis not present

## 2020-06-20 DIAGNOSIS — Z3202 Encounter for pregnancy test, result negative: Secondary | ICD-10-CM | POA: Diagnosis not present

## 2020-06-20 LAB — POCT PREGNANCY, URINE: Preg Test, Ur: NEGATIVE

## 2020-06-20 MED ORDER — MEDROXYPROGESTERONE ACETATE 150 MG/ML IM SUSP
150.0000 mg | Freq: Once | INTRAMUSCULAR | Status: AC
  Administered 2020-06-20: 150 mg via INTRAMUSCULAR

## 2020-06-20 NOTE — Progress Notes (Signed)
Heather Whitaker here for Depo-Provera  Injection outside of administration window. UPT today is negative; pt denies unprotected intercourse within the past 2 weeks. Injection administered without complication. Patient will return in 3 months for next injection and annual visit with a provider.  Marjo Bicker, RN 06/20/2020

## 2020-09-26 ENCOUNTER — Ambulatory Visit (INDEPENDENT_AMBULATORY_CARE_PROVIDER_SITE_OTHER): Payer: Medicaid Other | Admitting: Advanced Practice Midwife

## 2020-09-26 ENCOUNTER — Encounter: Payer: Self-pay | Admitting: Advanced Practice Midwife

## 2020-09-26 ENCOUNTER — Other Ambulatory Visit: Payer: Self-pay

## 2020-09-26 VITALS — BP 117/74 | HR 85 | Ht 65.5 in | Wt 147.0 lb

## 2020-09-26 DIAGNOSIS — Z01419 Encounter for gynecological examination (general) (routine) without abnormal findings: Secondary | ICD-10-CM | POA: Diagnosis not present

## 2020-09-26 DIAGNOSIS — Z3042 Encounter for surveillance of injectable contraceptive: Secondary | ICD-10-CM

## 2020-09-26 DIAGNOSIS — Z3202 Encounter for pregnancy test, result negative: Secondary | ICD-10-CM | POA: Diagnosis not present

## 2020-09-26 DIAGNOSIS — Z0000 Encounter for general adult medical examination without abnormal findings: Secondary | ICD-10-CM

## 2020-09-26 LAB — POCT PREGNANCY, URINE: Preg Test, Ur: NEGATIVE

## 2020-09-26 MED ORDER — MEDROXYPROGESTERONE ACETATE 150 MG/ML IM SUSP
150.0000 mg | Freq: Once | INTRAMUSCULAR | Status: AC
  Administered 2020-09-26: 150 mg via INTRAMUSCULAR

## 2020-09-26 NOTE — Patient Instructions (Signed)
Medroxyprogesterone injection [Contraceptive] What is this medicine? MEDROXYPROGESTERONE (me DROX ee proe JES te rone) contraceptive injections prevent pregnancy. They provide effective birth control for 3 months. Depo-subQ Provera 104 is also used for treating pain related to endometriosis. This medicine may be used for other purposes; ask your health care provider or pharmacist if you have questions. COMMON BRAND NAME(S): Depo-Provera, Depo-subQ Provera 104 What should I tell my health care provider before I take this medicine? They need to know if you have any of these conditions:  frequently drink alcohol  asthma  blood vessel disease or a history of a blood clot in the lungs or legs  bone disease such as osteoporosis  breast cancer  diabetes  eating disorder (anorexia nervosa or bulimia)  high blood pressure  HIV infection or AIDS  kidney disease  liver disease  mental depression  migraine  seizures (convulsions)  stroke  tobacco smoker  vaginal bleeding  an unusual or allergic reaction to medroxyprogesterone, other hormones, medicines, foods, dyes, or preservatives  pregnant or trying to get pregnant  breast-feeding How should I use this medicine? Depo-Provera Contraceptive injection is given into a muscle. Depo-subQ Provera 104 injection is given under the skin. These injections are given by a health care professional. You must not be pregnant before getting an injection. The injection is usually given during the first 5 days after the start of a menstrual period or 6 weeks after delivery of a baby. Talk to your pediatrician regarding the use of this medicine in children. Special care may be needed. These injections have been used in female children who have started having menstrual periods. Overdosage: If you think you have taken too much of this medicine contact a poison control center or emergency room at once. NOTE: This medicine is only for you. Do not  share this medicine with others. What if I miss a dose? Try not to miss a dose. You must get an injection once every 3 months to maintain birth control. If you cannot keep an appointment, call and reschedule it. If you wait longer than 13 weeks between Depo-Provera contraceptive injections or longer than 14 weeks between Depo-subQ Provera 104 injections, you could get pregnant. Use another method for birth control if you miss your appointment. You may also need a pregnancy test before receiving another injection. What may interact with this medicine? Do not take this medicine with any of the following medications:  bosentan This medicine may also interact with the following medications:  aminoglutethimide  antibiotics or medicines for infections, especially rifampin, rifabutin, rifapentine, and griseofulvin  aprepitant  barbiturate medicines such as phenobarbital or primidone  bexarotene  carbamazepine  medicines for seizures like ethotoin, felbamate, oxcarbazepine, phenytoin, topiramate  modafinil  St. John's wort This list may not describe all possible interactions. Give your health care provider a list of all the medicines, herbs, non-prescription drugs, or dietary supplements you use. Also tell them if you smoke, drink alcohol, or use illegal drugs. Some items may interact with your medicine. What should I watch for while using this medicine? This drug does not protect you against HIV infection (AIDS) or other sexually transmitted diseases. Use of this product may cause you to lose calcium from your bones. Loss of calcium may cause weak bones (osteoporosis). Only use this product for more than 2 years if other forms of birth control are not right for you. The longer you use this product for birth control the more likely you will be at risk   for weak bones. Ask your health care professional how you can keep strong bones. You may have a change in bleeding pattern or irregular periods.  Many females stop having periods while taking this drug. If you have received your injections on time, your chance of being pregnant is very low. If you think you may be pregnant, see your health care professional as soon as possible. Tell your health care professional if you want to get pregnant within the next year. The effect of this medicine may last a long time after you get your last injection. What side effects may I notice from receiving this medicine? Side effects that you should report to your doctor or health care professional as soon as possible:  allergic reactions like skin rash, itching or hives, swelling of the face, lips, or tongue  breast tenderness or discharge  breathing problems  changes in vision  depression  feeling faint or lightheaded, falls  fever  pain in the abdomen, chest, groin, or leg  problems with balance, talking, walking  unusually weak or tired  yellowing of the eyes or skin Side effects that usually do not require medical attention (report to your doctor or health care professional if they continue or are bothersome):  acne  fluid retention and swelling  headache  irregular periods, spotting, or absent periods  temporary pain, itching, or skin reaction at site where injected  weight gain This list may not describe all possible side effects. Call your doctor for medical advice about side effects. You may report side effects to FDA at 1-800-FDA-1088. Where should I keep my medicine? This does not apply. The injection will be given to you by a health care professional. NOTE: This sheet is a summary. It may not cover all possible information. If you have questions about this medicine, talk to your doctor, pharmacist, or health care provider.  2020 Elsevier/Gold Standard (2008-11-18 18:37:56)  

## 2020-09-26 NOTE — Progress Notes (Signed)
GYNECOLOGY ANNUAL PREVENTATIVE CARE ENCOUNTER NOTE  Subjective:   Heather Whitaker is a 24 y.o. G62P1001 female here for a routine annual gynecologic exam.  Current complaints: none.   Denies abnormal vaginal bleeding, discharge, pelvic pain, problems with intercourse or other gynecologic concerns.    Gynecologic History No LMP recorded. Patient has had an injection. Contraception: Depo-Provera injections Last Pap: 03/06/2018. Results were: normal Last mammogram: NA, age. Results were: NA age   Obstetric History OB History  Gravida Para Term Preterm AB Living  1 1 1  0 0 1  SAB TAB Ectopic Multiple Live Births  0 0 0 0 1    # Outcome Date GA Lbr Len/2nd Weight Sex Delivery Anes PTL Lv  1 Term 05/15/17 [redacted]w[redacted]d 01:41 / 00:33 7 lb 1.8 oz (3.226 kg) M Vag-Spont None  LIV    Past Medical History:  Diagnosis Date  . Back pain affecting pregnancy in third trimester 03/26/2017  . Irregular uterine contractions 03/26/2017  . Medical history non-contributory     Past Surgical History:  Procedure Laterality Date  . MYRINGOTOMY WITH TUBE PLACEMENT      Current Outpatient Medications on File Prior to Visit  Medication Sig Dispense Refill  . medroxyPROGESTERone (DEPO-PROVERA) 150 MG/ML injection Inject 150 mg into the muscle every 3 (three) months.    . fluconazole (DIFLUCAN) 150 MG tablet Take one tablet by mouth on Day 1 and one tablet on Day 4 (Patient not taking: Reported on 09/26/2020) 2 tablet 0  . ibuprofen (ADVIL) 600 MG tablet Take 1 tablet (600 mg total) by mouth every 6 (six) hours as needed. (Patient not taking: Reported on 09/26/2020) 30 tablet 0  . megestrol (MEGACE) 40 MG tablet Take one morning and night until the bleeding stops, then taper to once a day for 3 days. (Patient not taking: Reported on 09/26/2020) 45 tablet 1  . metroNIDAZOLE (FLAGYL) 500 MG tablet Take 1 tablet (500 mg total) by mouth 2 (two) times daily. No alcohol while taking this medication (Patient not taking:  Reported on 09/26/2020) 14 tablet 0   No current facility-administered medications on file prior to visit.    No Known Allergies  Social History   Socioeconomic History  . Marital status: Single    Spouse name: Not on file  . Number of children: Not on file  . Years of education: Not on file  . Highest education level: Not on file  Occupational History  . Not on file  Tobacco Use  . Smoking status: Never Smoker  . Smokeless tobacco: Never Used  Substance and Sexual Activity  . Alcohol use: No  . Drug use: No  . Sexual activity: Yes    Birth control/protection: None  Other Topics Concern  . Not on file  Social History Narrative  . Not on file   Social Determinants of Health   Financial Resource Strain:   . Difficulty of Paying Living Expenses: Not on file  Food Insecurity: No Food Insecurity  . Worried About 09/28/2020 in the Last Year: Never true  . Ran Out of Food in the Last Year: Never true  Transportation Needs: No Transportation Needs  . Lack of Transportation (Medical): No  . Lack of Transportation (Non-Medical): No  Physical Activity:   . Days of Exercise per Week: Not on file  . Minutes of Exercise per Session: Not on file  Stress:   . Feeling of Stress : Not on file  Social Connections:   . Frequency  of Communication with Friends and Family: Not on file  . Frequency of Social Gatherings with Friends and Family: Not on file  . Attends Religious Services: Not on file  . Active Member of Clubs or Organizations: Not on file  . Attends Banker Meetings: Not on file  . Marital Status: Not on file  Intimate Partner Violence:   . Fear of Current or Ex-Partner: Not on file  . Emotionally Abused: Not on file  . Physically Abused: Not on file  . Sexually Abused: Not on file    Family History  Problem Relation Age of Onset  . Hypertension Maternal Grandfather   . Asthma Brother   . Cancer Neg Hx   . Diabetes Neg Hx     The  following portions of the patient's history were reviewed and updated as appropriate: allergies, current medications, past family history, past medical history, past social history, past surgical history and problem list.  Review of Systems Pertinent items noted in HPI and remainder of comprehensive ROS otherwise negative.   Objective:  BP 117/74   Pulse 85   Ht 5' 5.5" (1.664 m)   Wt 147 lb (66.7 kg)   BMI 24.09 kg/m    CONSTITUTIONAL: Well-developed, well-nourished female in no acute distress.  HENT:  Normocephalic, atraumatic, External right and left ear normal. Oropharynx is clear and moist EYES: Conjunctivae and EOM are normal. Pupils are equal, round, and reactive to light. No scleral icterus.  NECK: Normal range of motion, supple, no masses.  Normal thyroid.  SKIN: Skin is warm and dry. No rash noted. Not diaphoretic. No erythema. No pallor. NEUROLOGIC: Alert and oriented to person, place, and time. Normal reflexes, muscle tone coordination. No cranial nerve deficit noted. PSYCHIATRIC: Normal mood and affect. Normal behavior. Normal judgment and thought content. CARDIOVASCULAR: Normal heart rate noted, regular rhythm RESPIRATORY: Clear to auscultation bilaterally. Effort and breath sounds normal, no problems with respiration noted. BREASTS: Symmetric in size. No masses, skin changes, nipple drainage, or lymphadenopathy. ABDOMEN: Soft, normal bowel sounds, no distention noted.  No tenderness, rebound or guarding.  MUSCULOSKELETAL: Normal range of motion. No tenderness.  No cyanosis, clubbing, or edema.  2+ distal pulses.   Assessment and Plan:  1. Surveillance for Depo-Provera contraception - medroxyPROGESTERone (DEPO-PROVERA) injection 150 mg  2. Well woman exam without gynecological exam - Needs pap next year   Routine preventative health maintenance measures emphasized. Please refer to After Visit Summary for other counseling recommendations.    Thressa Sheller DNP, CNM   09/26/20  4:03 PM

## 2020-12-11 ENCOUNTER — Other Ambulatory Visit: Payer: Self-pay

## 2020-12-11 ENCOUNTER — Ambulatory Visit (INDEPENDENT_AMBULATORY_CARE_PROVIDER_SITE_OTHER): Payer: Medicaid Other

## 2020-12-11 VITALS — BP 110/64 | HR 95 | Ht 65.5 in | Wt 143.7 lb

## 2020-12-11 DIAGNOSIS — Z3042 Encounter for surveillance of injectable contraceptive: Secondary | ICD-10-CM

## 2020-12-11 MED ORDER — MEDROXYPROGESTERONE ACETATE 150 MG/ML IM SUSP
150.0000 mg | Freq: Once | INTRAMUSCULAR | Status: AC
  Administered 2020-12-11: 150 mg via INTRAMUSCULAR

## 2020-12-11 NOTE — Progress Notes (Unsigned)
Heather Whitaker here for Depo-Provera Injection. Injection administered without complication. Patient will return in 3 months for next injection between April 18 and Mar 12, 2021. Next annual visit due Nov 2022. Pt aware and verbalized understanding. Will make appt at front office today .  Isabell Jarvis, RN 12/11/2020  3:33 PM

## 2021-02-26 ENCOUNTER — Other Ambulatory Visit (HOSPITAL_COMMUNITY)
Admission: RE | Admit: 2021-02-26 | Discharge: 2021-02-26 | Disposition: A | Discharge status: Home / Self Care | Payer: Medicaid Other | Source: Ambulatory Visit | Attending: Family Medicine | Admitting: Family Medicine

## 2021-02-26 ENCOUNTER — Encounter: Payer: Self-pay | Admitting: Family Medicine

## 2021-02-26 ENCOUNTER — Other Ambulatory Visit: Payer: Self-pay

## 2021-02-26 ENCOUNTER — Ambulatory Visit: Payer: Medicaid Other | Admitting: *Deleted

## 2021-02-26 ENCOUNTER — Ambulatory Visit (INDEPENDENT_AMBULATORY_CARE_PROVIDER_SITE_OTHER): Payer: Medicaid Other | Admitting: Family Medicine

## 2021-02-26 VITALS — BP 110/71 | HR 77

## 2021-02-26 DIAGNOSIS — Z113 Encounter for screening for infections with a predominantly sexual mode of transmission: Secondary | ICD-10-CM

## 2021-02-26 DIAGNOSIS — Z124 Encounter for screening for malignant neoplasm of cervix: Secondary | ICD-10-CM

## 2021-02-26 DIAGNOSIS — Z30011 Encounter for initial prescription of contraceptive pills: Secondary | ICD-10-CM

## 2021-02-26 MED ORDER — LEVONORGESTREL 1.5 MG PO TABS: 1.5000 mg | ORAL_TABLET | Freq: Once | ORAL | 0 refills | Status: AC

## 2021-02-26 MED ORDER — NORGESTIMATE-ETH ESTRADIOL 0.25-35 MG-MCG PO TABS: 1.0000 | ORAL_TABLET | Freq: Every day | ORAL | 3 refills | Status: DC

## 2021-02-26 NOTE — Progress Notes (Signed)
    Contraception/Family Planning VISIT ENCOUNTER NOTE  Subjective:   Heather Whitaker is a 25 y.o. G77P1001 female here for reproductive life counseling.  Desires something she does not have to remember and something that decreased her bleeding from [facility].  Reports she does not want a pregnancy in the next year. Denies abnormal vaginal bleeding, discharge, pelvic pain, problems with intercourse or other gynecologic concerns.    Gynecologic History No LMP recorded. Patient has had an injection. Contraception: Depo-Provera injections  Health Maintenance Due  Topic Date Due  . Hepatitis C Screening  Never done  . HPV VACCINES (1 - 2-dose series) Never done  . PAP-Cervical Cytology Screening  03/06/2021  . PAP SMEAR-Modifier  03/06/2021     The following portions of the patient's history were reviewed and updated as appropriate: allergies, current medications, past family history, past medical history, past social history, past surgical history and problem list.  Review of Systems Pertinent items are noted in HPI.   Objective:  BP 110/71   Pulse 77  Gen: well appearing, NAD HEENT: no scleral icterus CV: RR Lung: Normal WOB Ext: warm well perfused  PELVIC: Normal appearing external genitalia; normal appearing vaginal mucosa and cervix.  No abnormal discharge noted.  Pap smear obtained.  Normal uterine size, no other palpable masses, no uterine or adnexal tenderness.   Assessment and Plan:   Contraception counseling: Reviewed all forms of birth control options available including abstinence; over the counter/barrier methods; hormonal contraceptive medication including pill, patch, ring, injection,contraceptive implant; hormonal and nonhormonal IUDs; permanent sterilization options including vasectomy and the various tubal sterilization modalities. Risks and benefits reviewed.  Questions were answered.  Written information was also given to the patient to review.  Patient desires OCP, this  was prescribed for patient. She will follow up in  1 year for surveillance.  She was told to call with any further questions, or with any concerns about this method of contraception.  Emphasized use of condoms 100% of the time for STI prevention.  1. Screening for STD (sexually transmitted disease) GC/CT from pap  2. Papanicolaou smear for cervical cancer screening - Cytology - PAP( [place])  3. Encounter for initial prescription of contraceptive pills Counseled per above  Reviewed taking OCP around same time everyday Discussed when to use Plan B - norgestimate-ethinyl estradiol (ORTHO-CYCLEN) 0.25-35 MG-MCG tablet; Take 1 tablet by mouth daily.  Dispense: 84 tablet; Refill: 3    Please refer to After Visit Summary for other counseling recommendations.   Return in about 3 months (around 05/28/2021), or if not taking OCP regularly and at risk for pregnancy.  Federico Flake, MD, MPH, ABFM Attending Physician Faculty Practice- [facility]

## 2021-02-26 NOTE — Progress Notes (Signed)
Last Depo injection 12/11/20

## 2021-03-01 LAB — CYTOLOGY - PAP
Chlamydia: NEGATIVE
Comment: NEGATIVE
Comment: NEGATIVE
Comment: NEGATIVE
Comment: NORMAL
Diagnosis: UNDETERMINED — AB
High risk HPV: POSITIVE — AB
Neisseria Gonorrhea: NEGATIVE
Trichomonas: NEGATIVE

## 2021-03-05 ENCOUNTER — Telehealth: Payer: Self-pay | Admitting: Lactation Services

## 2021-03-05 NOTE — Telephone Encounter (Signed)
Patient left message on nurse voicemail that she would like a call to discuss her test results.

## 2021-03-05 NOTE — Telephone Encounter (Signed)
Called patient and reviewed with her Pap Smear results. Reviewed that Dr. Alvester Morin would like her to have a Colposcopy to determine significance of results. Patient concerned about Cancer, reviewed with her that yes there are cases that can turn into cancer and why the need for further follow up.   Patient aware front desk will be calling her to schedule Colposcopy.   All questions answered and patient advised to call with further questions or concerns.

## 2021-03-05 NOTE — Telephone Encounter (Signed)
-----   Message from Federico Flake, MD sent at 03/05/2021  9:42 AM EDT ----- ASCUS, HPV positive. Needs colposcopy scheduled

## 2021-03-05 NOTE — Telephone Encounter (Signed)
Returned patient call. She did not answer. LM for her to call the office at her convenience. My Chart Message sent.

## 2021-03-23 ENCOUNTER — Ambulatory Visit: Payer: Medicaid Other | Admitting: Obstetrics & Gynecology

## 2021-03-28 ENCOUNTER — Encounter: Payer: Self-pay | Admitting: Obstetrics & Gynecology

## 2021-03-28 ENCOUNTER — Other Ambulatory Visit (HOSPITAL_COMMUNITY)
Admission: RE | Admit: 2021-03-28 | Discharge: 2021-03-28 | Disposition: A | Discharge status: Home / Self Care | Payer: Medicaid Other | Source: Ambulatory Visit | Attending: Obstetrics & Gynecology | Admitting: Obstetrics & Gynecology

## 2021-03-28 ENCOUNTER — Ambulatory Visit (INDEPENDENT_AMBULATORY_CARE_PROVIDER_SITE_OTHER): Payer: Self-pay | Admitting: Obstetrics & Gynecology

## 2021-03-28 ENCOUNTER — Other Ambulatory Visit: Payer: Self-pay

## 2021-03-28 VITALS — BP 103/74 | HR 82 | Wt 148.9 lb

## 2021-03-28 DIAGNOSIS — R87610 Atypical squamous cells of undetermined significance on cytologic smear of cervix (ASC-US): Secondary | ICD-10-CM | POA: Insufficient documentation

## 2021-03-28 DIAGNOSIS — R87810 Cervical high risk human papillomavirus (HPV) DNA test positive: Secondary | ICD-10-CM

## 2021-03-28 DIAGNOSIS — R87612 Low grade squamous intraepithelial lesion on cytologic smear of cervix (LGSIL): Secondary | ICD-10-CM | POA: Insufficient documentation

## 2021-03-28 DIAGNOSIS — Z3202 Encounter for pregnancy test, result negative: Secondary | ICD-10-CM

## 2021-03-28 LAB — POCT PREGNANCY, URINE: Preg Test, Ur: NEGATIVE

## 2021-03-28 NOTE — Patient Instructions (Signed)

## 2021-03-28 NOTE — Addendum Note (Signed)
Addended by: Adam Phenix on: 03/28/2021 11:52 AM   Modules accepted: Orders

## 2021-03-28 NOTE — Progress Notes (Signed)
Patient ID: Heather Whitaker, female   DOB: Jan 26, 1996, 25 y.o.   MRN: [id number]  Chief Complaint  Patient presents with  . Colposcopy    HPI Heather Whitaker is a 25 y.o. female.  G1P1001 No LMP recorded. Patient has had an injection.  HPI  Indications: Pap smear on April 2022 showed: ASCUS with POSITIVE high risk HPV. Previous colposcopy: none. Prior cervical treatment: no treatment.  Past Medical History:  Diagnosis Date  . Back pain affecting pregnancy in third trimester 03/26/2017  . Irregular uterine contractions 03/26/2017  . Medical history non-contributory     Past Surgical History:  Procedure Laterality Date  . MYRINGOTOMY WITH TUBE PLACEMENT      Family History  Problem Relation Age of Onset  . Hypertension Maternal Grandfather   . Asthma Brother   . Cancer Neg Hx   . Diabetes Neg Hx     Social History Social History   Tobacco Use  . Smoking status: Never Smoker  . Smokeless tobacco: Never Used  Substance Use Topics  . Alcohol use: No  . Drug use: No    No Known Allergies  Current Outpatient Medications  Medication Sig Dispense Refill  . norgestimate-ethinyl estradiol (ORTHO-CYCLEN) 0.25-35 MG-MCG tablet Take 1 tablet by mouth daily. (Patient not taking: Reported on 03/28/2021) 84 tablet 3   No current facility-administered medications for this visit.    Review of Systems Review of Systems  Constitutional: Negative.   Genitourinary: Negative for pelvic pain, vaginal bleeding and vaginal discharge.    Blood pressure 103/74, pulse 82, weight 148 lb 14.4 oz (67.5 kg), unknown if currently breastfeeding.  Physical Exam Physical Exam Vitals and nursing note reviewed. Exam conducted with a chaperone present.  Constitutional:      Appearance: Normal appearance.  Pulmonary:     Effort: Pulmonary effort is normal.  Genitourinary:    General: Normal vulva.  Neurological:     Mental Status: She is alert.  Psychiatric:        Mood and Affect: Mood normal.      Data Reviewed Patient given informed consent, signed copy in the chart, time out was performed.  Placed in lithotomy position. Cervix viewed with speculum and colposcope after application of acetic acid.   Colposcopy adequate?  yes Acetowhite lesions? yes Punctation? no Mosaicism?  No  Abnormal vasculature?  no Biopsies? Yes at 2 ECC? yes  COMMENTS:  Patient was given post procedure instructions.  .    Assessment    Procedure Details  The risks and benefits of the procedure and Written informed consent obtained. 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     Plan    Specimens labelled and sent to Pathology.       Scheryl Darter 03/28/2021, 11:33 AM

## 2021-03-30 LAB — SURGICAL PATHOLOGY

## 2021-04-10 ENCOUNTER — Telehealth: Payer: Self-pay | Admitting: Lactation Services

## 2021-04-10 NOTE — Progress Notes (Signed)
CIN 1 repeat pap in 12 months

## 2021-04-10 NOTE — Telephone Encounter (Signed)
-----   Message from Adam Phenix, MD sent at 04/10/2021  9:51 AM EDT ----- CIN 1 repeat pap in 12 months

## 2021-04-10 NOTE — Telephone Encounter (Signed)
Called and informed patient of results of Colposcopy showing mild changes on the cervix. Advised she needs another Pap Smear in 1 year. Advised for her to call the office at [phone] in February to schedule a repeat pap.   Patient asked if she can have unprotected sex. Reviewed that can be passed to partner as is sexually transmitted. Reviewed if she has the same partner, they most likely already have the virus also.   Patient with no other questions or concerns at this time.

## 2021-07-19 ENCOUNTER — Telehealth: Payer: Self-pay

## 2021-07-19 DIAGNOSIS — Z30011 Encounter for initial prescription of contraceptive pills: Secondary | ICD-10-CM

## 2021-07-19 MED ORDER — NORGESTIMATE-ETH ESTRADIOL 0.25-35 MG-MCG PO TABS: 1.0000 | ORAL_TABLET | Freq: Every day | ORAL | 3 refills | Status: DC

## 2021-07-19 NOTE — Telephone Encounter (Signed)
Patient called and is wanting her birth control pill sent back over to the pharmacy. She wasn't able to afford them at the time when sent in the first time and is now able to afford them. Please send in   Please call and advise    [facility] [phone] [place], [place] - [address] AT [facility] [address]  [address] [place] [address]

## 2021-07-19 NOTE — Telephone Encounter (Signed)
Call placed back to pt. Spoke with pt. Pt states being off BC since May. Pt states now has money to pay for OCPs and needs new Rx. Pt had AEX with Dr Alvester Morin in 02/2021 and saw Dr Debroah Loop for a colpo in May 2022. Pt states been having regular cycles monthly since last OCP in May 2022.  Has not checked UPT at home, but had LMP: 07/11/21. Pt ended cycle on 9/5 and has not had IC since cycle. Pt advised to remain abstitent and use back up method for 2 weeks once restarting OCPs. Pt verbalized understanding. New Rx sent to pharmacy.   Judeth Cornfield, RN

## 2021-11-27 ENCOUNTER — Ambulatory Visit (INDEPENDENT_AMBULATORY_CARE_PROVIDER_SITE_OTHER): Payer: Medicaid Other | Admitting: Family Medicine

## 2021-11-27 ENCOUNTER — Other Ambulatory Visit: Payer: Self-pay

## 2021-11-27 ENCOUNTER — Encounter: Payer: Self-pay | Admitting: Family Medicine

## 2021-11-27 VITALS — BP 96/65 | HR 85 | Wt 134.0 lb

## 2021-11-27 DIAGNOSIS — Z3042 Encounter for surveillance of injectable contraceptive: Secondary | ICD-10-CM

## 2021-11-27 LAB — POCT PREGNANCY, URINE: Preg Test, Ur: NEGATIVE

## 2021-11-27 MED ORDER — MEDROXYPROGESTERONE ACETATE 150 MG/ML IM SUSP
150.0000 mg | Freq: Once | INTRAMUSCULAR | Status: AC
  Administered 2021-11-27: 150 mg via INTRAMUSCULAR

## 2021-11-27 NOTE — Progress Notes (Signed)
° °  GYNECOLOGY OFFICE VISIT NOTE  History:   Heather Whitaker is a 26 y.o. G1P1001 here today to discuss restarting Depo.   Last had Depo shot April of 2022 Tried OCPs for a while but did not like side effects and had a hard time remembering to take the pill every day LMP 1/10, last unprotected intercourse was over 1 year ago  She denies any abnormal discharge, pelvic pain, dysuria  No concerns for STI; screening declined She has no other concerns today    Past Medical History:  Diagnosis Date   Back pain affecting pregnancy in third trimester 03/26/2017   Irregular uterine contractions 03/26/2017   Medical history non-contributory     Past Surgical History:  Procedure Laterality Date   MYRINGOTOMY WITH TUBE PLACEMENT     The following portions of the patient's history were reviewed and updated as appropriate: allergies, current medications, past family history, past medical history, past social history, past surgical history and problem list.   Health Maintenance:  Last pap ASCUS with + HPV on 02/2021. Colpo with CIN 1. Repeat due 03/2022.   Review of Systems:  Pertinent items noted in HPI and remainder of comprehensive ROS otherwise negative.  Physical Exam:  BP 96/65    Pulse 85    Wt 134 lb (60.8 kg)    LMP 11/20/2021 (Approximate)    Breastfeeding No    BMI 21.96 kg/m   CONSTITUTIONAL: Well-developed, well-nourished female in no acute distress.  HEENT:  Normocephalic, atraumatic. EOMI, conjunctivae clear. CARDIOVASCULAR: Normal heart rate noted. RESPIRATORY: Normal work of breathing on room air.  MUSCULOSKELETAL: Normal range of motion. No LE edema noted. NEUROLOGIC: Alert and oriented to person, place, and time. No focal deficit noted.  PSYCHIATRIC: Normal mood and affect. Normal behavior. Normal judgment and thought content.  Labs and Imaging Results for orders placed or performed in visit on 11/27/21 (from the past 168 hour(s))  Pregnancy, urine POC   Collection Time:  11/27/21  9:13 AM  Result Value Ref Range   Preg Test, Ur NEGATIVE NEGATIVE   No results found.    Assessment and Plan:  1. Encounter for surveillance of injectable contraceptive Birth control counseling provided. UPT negative and LMP 1/10, no recent unprotected intercourse. Safe to restart Depo today. Administered and tolerated without issue. Will return for next dose in 3 months.   Routine preventative health maintenance measures emphasized. Discussed need for pap in 03/2022.  Please refer to After Visit Summary for other counseling recommendations.   Evalina Field, MD OB Fellow, Faculty [facility], [facility] for [facility] 11/27/2021 12:37 PM

## 2021-11-27 NOTE — Progress Notes (Signed)
Patient is here because she would like to get back on Depo provera. Last injection was around April of 2022. She stated she was using birth control pills but could not remember to take them daily.  Last birth control pil: "beginning of this year"  LMP: 11/20/21  Unprotected sex: "a whole year ago"   UPT: Negative   Depo provera injection administered into right upper outer quad without any complications. Patient will return between 02/12/22 and 02/25/22. Dates were given to patient and she verbalized understanding.   Smayan Hackbart, CMA

## 2021-11-29 ENCOUNTER — Encounter (HOSPITAL_BASED_OUTPATIENT_CLINIC_OR_DEPARTMENT_OTHER): Payer: Self-pay

## 2021-11-29 ENCOUNTER — Other Ambulatory Visit: Payer: Self-pay

## 2021-11-29 DIAGNOSIS — K0889 Other specified disorders of teeth and supporting structures: Secondary | ICD-10-CM | POA: Insufficient documentation

## 2021-11-29 NOTE — ED Triage Notes (Signed)
Pt c/o dental pain to the R side for "months."

## 2021-11-30 ENCOUNTER — Emergency Department (HOSPITAL_BASED_OUTPATIENT_CLINIC_OR_DEPARTMENT_OTHER)
Admission: EM | Admit: 2021-11-30 | Discharge: 2021-11-30 | Disposition: A | Discharge status: Home / Self Care | Payer: Medicaid Other | Attending: Emergency Medicine | Admitting: Emergency Medicine

## 2021-11-30 DIAGNOSIS — K0889 Other specified disorders of teeth and supporting structures: Secondary | ICD-10-CM

## 2021-11-30 MED ORDER — ACETAMINOPHEN 325 MG PO TABS
650.0000 mg | ORAL_TABLET | Freq: Once | ORAL | Status: AC
  Administered 2021-11-30: 650 mg via ORAL
  Filled 2021-11-30: qty 2

## 2021-11-30 MED ORDER — PENICILLIN V POTASSIUM 250 MG PO TABS
500.0000 mg | ORAL_TABLET | Freq: Once | ORAL | Status: AC
  Administered 2021-11-30: 500 mg via ORAL
  Filled 2021-11-30: qty 2

## 2021-11-30 MED ORDER — MELOXICAM 15 MG PO TABS: 15.0000 mg | ORAL_TABLET | Freq: Every day | ORAL | 0 refills | Status: DC

## 2021-11-30 MED ORDER — PENICILLIN V POTASSIUM 500 MG PO TABS: 500.0000 mg | ORAL_TABLET | Freq: Four times a day (QID) | ORAL | 0 refills | Status: AC

## 2021-11-30 MED ORDER — KETOROLAC TROMETHAMINE 60 MG/2ML IM SOLN
30.0000 mg | Freq: Once | INTRAMUSCULAR | Status: AC
  Administered 2021-11-30: 30 mg via INTRAMUSCULAR
  Filled 2021-11-30: qty 2

## 2021-11-30 NOTE — ED Provider Notes (Signed)
[facility] EMERGENCY DEPT Provider Note   CSN: [id number] Arrival date & time: 11/29/21  2247     History  Chief Complaint  Patient presents with   Dental Pain    Heather Whitaker is a 26 y.o. female.  Has seen dentistry in the past. Plan to see again but pain got bad tonight so came in.    Dental Pain Location:  Lower Quality:  Aching Severity:  Mild Onset quality:  Gradual Duration:  3 months Timing:  Constant Progression:  Worsening Chronicity:  Chronic Context: poor dentition   Context: not abscess       Home Medications Prior to Admission medications   Medication Sig Start Date End Date Taking? Authorizing Provider  meloxicam (MOBIC) 15 MG tablet Take 1 tablet (15 mg total) by mouth daily. 11/30/21  Yes Mechille Varghese, Barbara Cower, MD  penicillin v potassium (VEETID) 500 MG tablet Take 1 tablet (500 mg total) by mouth 4 (four) times daily for 10 days. 11/30/21 12/10/21 Yes Shayonna Ocampo, Barbara Cower, MD  norgestimate-ethinyl estradiol (ORTHO-CYCLEN) 0.25-35 MG-MCG tablet Take 1 tablet by mouth daily. Patient not taking: Reported on 11/27/2021 07/19/21   Federico Flake, MD      Allergies    Patient has no known allergies.    Review of Systems   Review of Systems  Physical Exam Updated Vital Signs BP 120/77    Pulse 67    Temp 98.8 F (37.1 C)    Resp 16    Ht 5\' 5"  (1.651 m)    Wt 60.8 kg    LMP 11/20/2021 (Approximate)    SpO2 95%    BMI 22.30 kg/m  Physical Exam Vitals and nursing note reviewed.  Constitutional:      Appearance: She is well-developed.  HENT:     Head: Normocephalic and atraumatic.     Mouth/Throat:     Mouth: Mucous membranes are moist.     Dentition: Dental tenderness and gingival swelling present. No dental caries or dental abscesses.     Pharynx: Oropharynx is clear.  Eyes:     Pupils: Pupils are equal, round, and reactive to light.  Cardiovascular:     Rate and Rhythm: Normal rate and regular rhythm.  Pulmonary:     Effort: No  respiratory distress.     Breath sounds: No stridor.  Abdominal:     General: Abdomen is flat. There is no distension.  Musculoskeletal:        General: No swelling or tenderness. Normal range of motion.     Cervical back: Normal range of motion.  Skin:    General: Skin is warm and dry.  Neurological:     General: No focal deficit present.     Mental Status: She is alert.    ED Results / Procedures / Treatments   Labs (all labs ordered are listed, but only abnormal results are displayed) Labs Reviewed - No data to display  EKG None  Radiology No results found.  Procedures Procedures    Medications Ordered in ED Medications  acetaminophen (TYLENOL) tablet 650 mg (650 mg Oral Given 11/30/21 0049)  ketorolac (TORADOL) injection 30 mg (30 mg Intramuscular Given 11/30/21 0146)  penicillin v potassium (VEETID) tablet 500 mg (500 mg Oral Given 11/30/21 0146)    ED Course/ Medical Decision Making/ A&P                           Medical Decision Making Risk OTC drugs. Prescription  drug management.   Dental pain with possible early infection.  No evidence of abscess, broken tooth or other severe etiology.  Refer to dentistry.  We will start antibiotics.  No evidence of Ludwigs's, Mnire's or other complication.   Final Clinical Impression(s) / ED Diagnoses Final diagnoses:  Pain, dental    Rx / DC Orders ED Discharge Orders          Ordered    penicillin v potassium (VEETID) 500 MG tablet  4 times daily        11/30/21 0136    meloxicam (MOBIC) 15 MG tablet  Daily        11/30/21 0136              Kaylena Pacifico, Barbara Cower, MD 11/30/21 0405

## 2022-02-12 ENCOUNTER — Ambulatory Visit (INDEPENDENT_AMBULATORY_CARE_PROVIDER_SITE_OTHER): Payer: Medicaid Other

## 2022-02-12 VITALS — BP 112/74 | HR 80 | Wt 141.0 lb

## 2022-02-12 DIAGNOSIS — Z3042 Encounter for surveillance of injectable contraceptive: Secondary | ICD-10-CM | POA: Diagnosis not present

## 2022-02-12 MED ORDER — MEDROXYPROGESTERONE ACETATE 150 MG/ML IM SUSP
150.0000 mg | Freq: Once | INTRAMUSCULAR | Status: AC
  Administered 2022-02-12: 150 mg via INTRAMUSCULAR

## 2022-02-12 NOTE — Progress Notes (Signed)
Heather Whitaker here for Depo-Provera Injection. Injection administered without complication. Patient will return in 3 months for next injection between June 20 and July 4. Next annual visit due May 2023.  ? ?Seth Bake, RN ?02/12/2022   ?

## 2022-03-01 IMAGING — CR DG ANKLE COMPLETE 3+V*R*
3 series · 3 of 3 positions shown · non-contrast
Comparison: None.

CLINICAL DATA: Right ankle pain after fall last night.

EXAM:
RIGHT ANKLE - COMPLETE 3+ VIEW

[x ankle ap right]
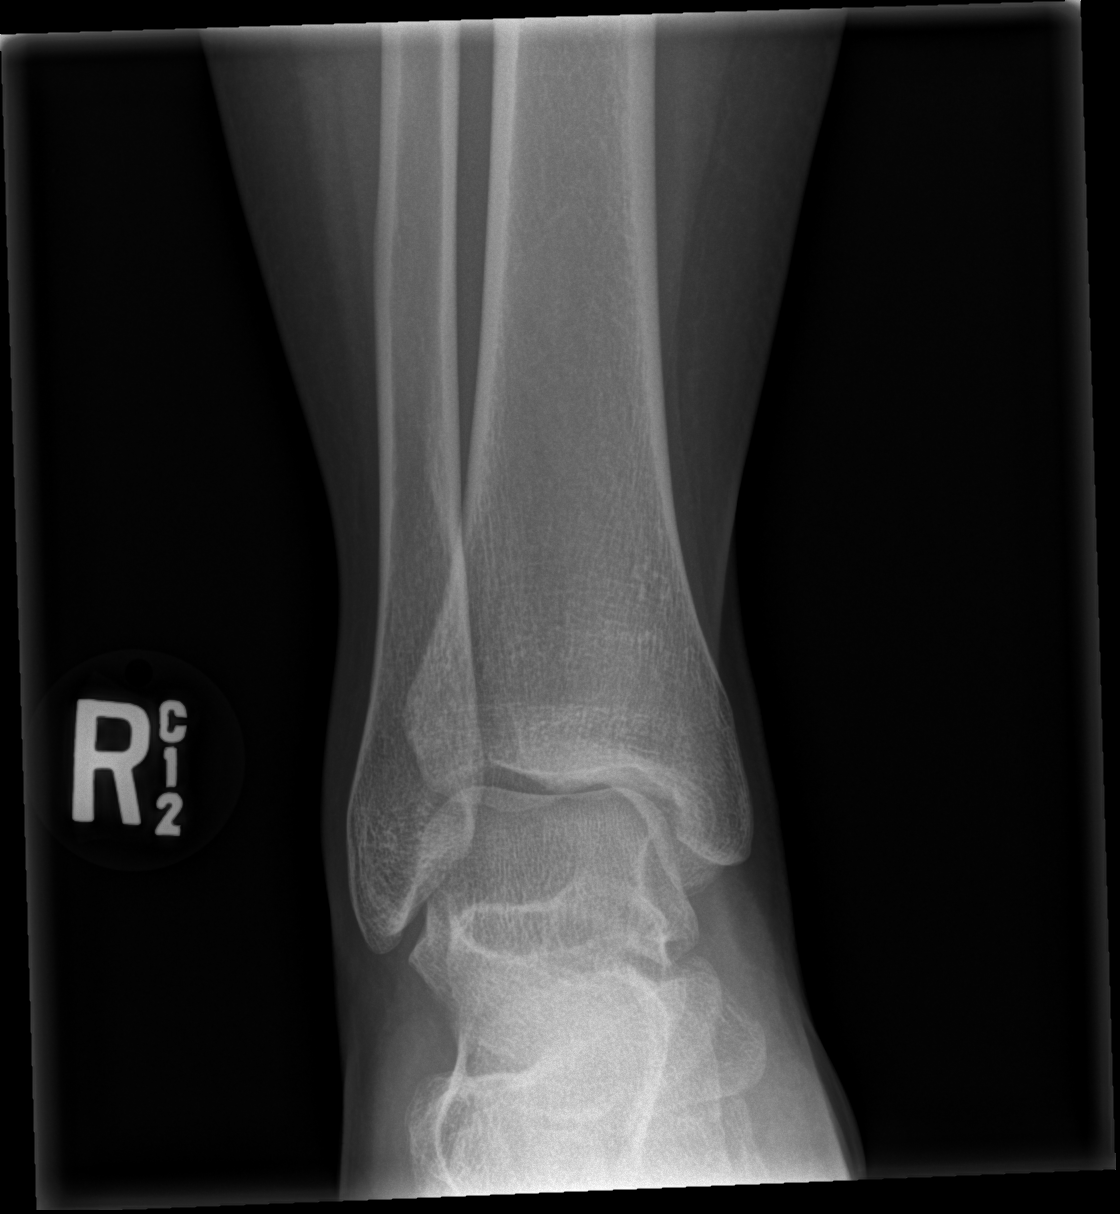

[x ankle obl right]
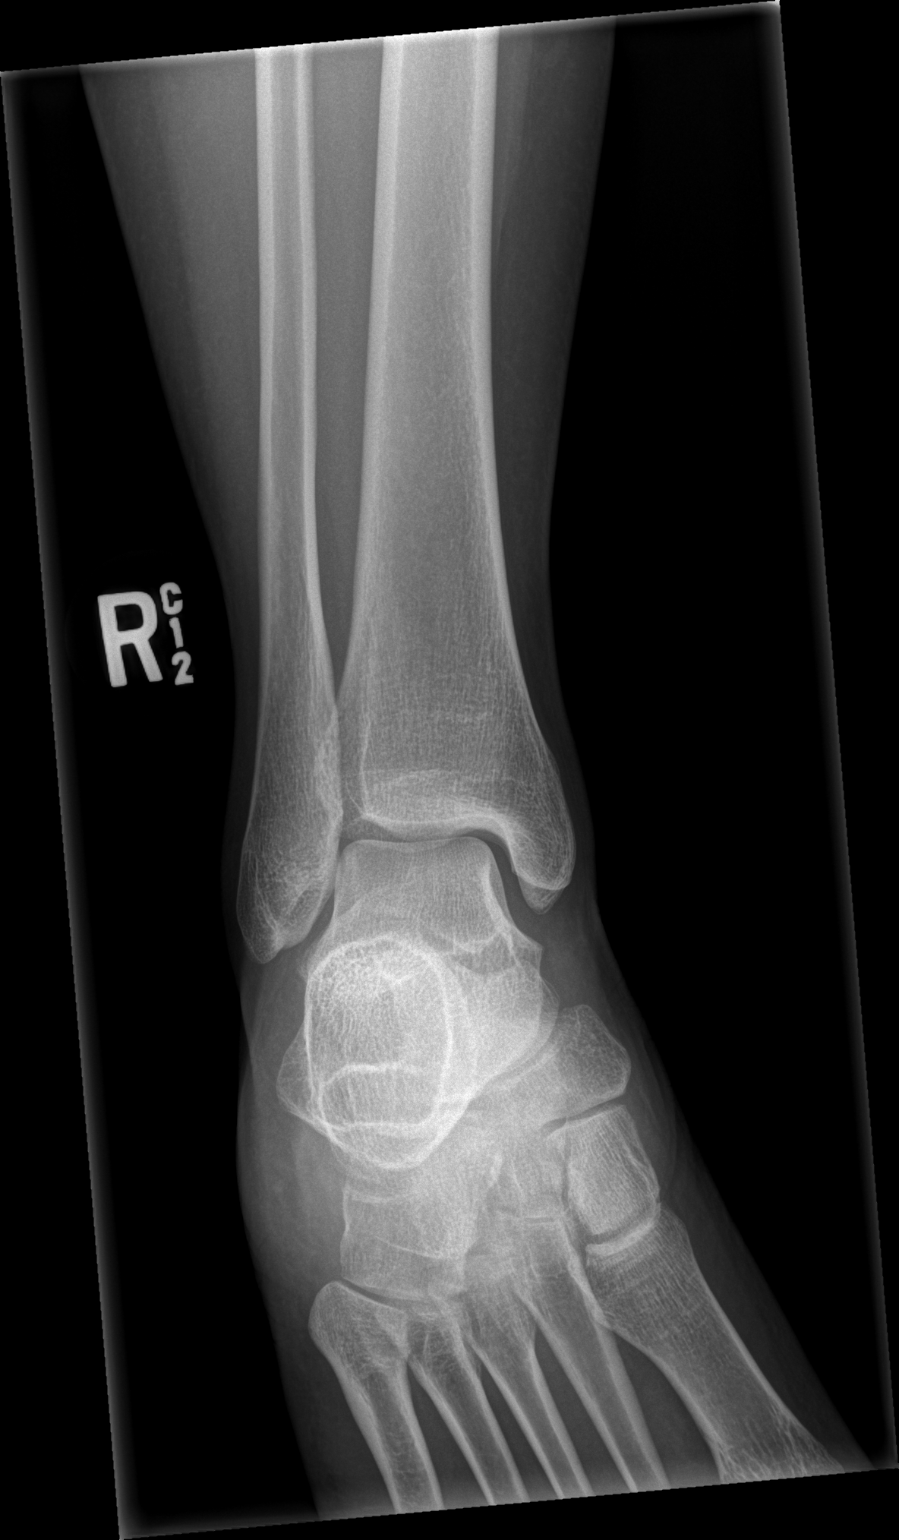

[x ankle lat right]
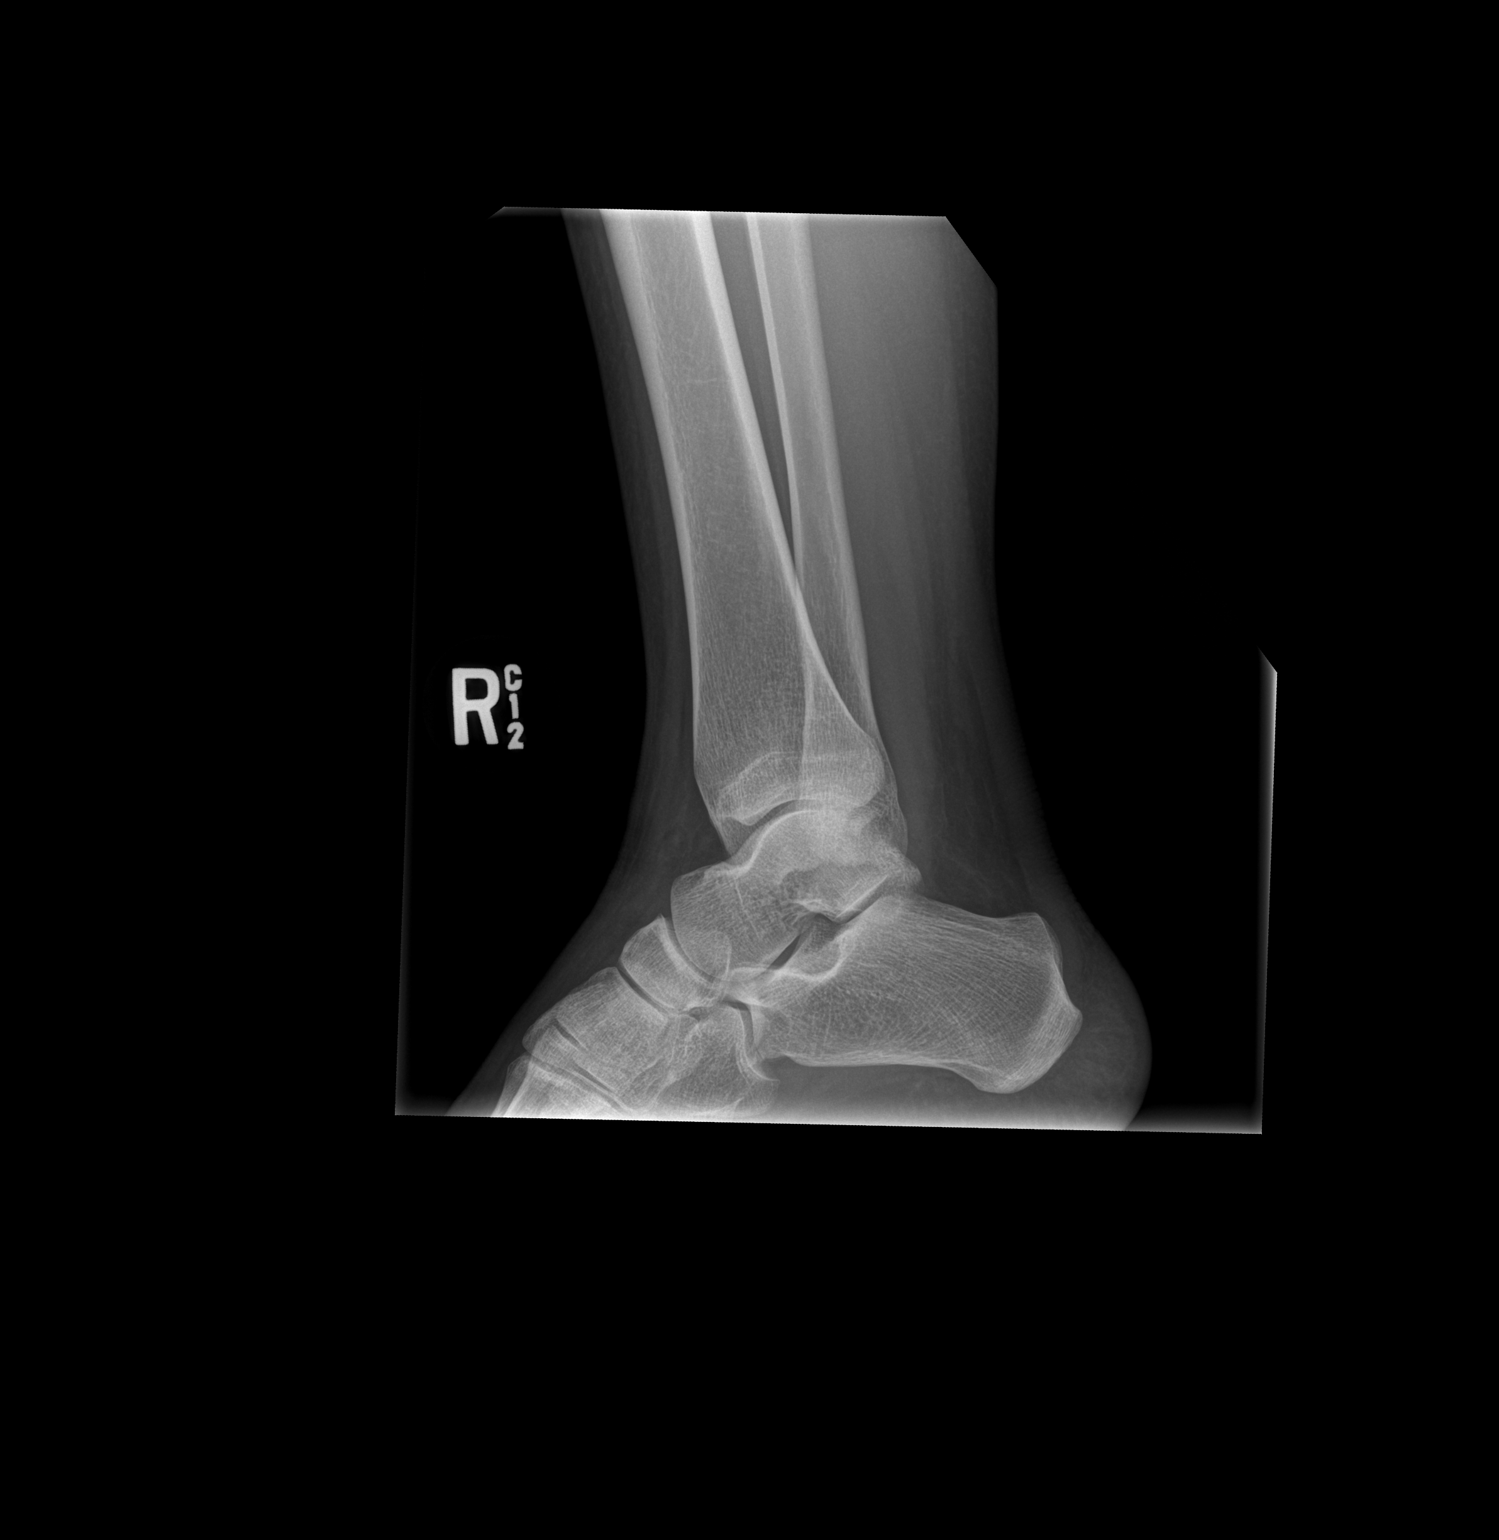

[3 of 3 positions shown; findings below may reference images not displayed]

FINDINGS: No acute fracture or dislocation. The ankle mortise is symmetric.
The talar dome is intact. No tibiotalar joint effusion. Joint spaces
are preserved. Bone mineralization is normal. Soft tissues are
unremarkable.
IMPRESSION: Negative.

## 2022-05-02 ENCOUNTER — Other Ambulatory Visit (HOSPITAL_COMMUNITY)
Admission: RE | Admit: 2022-05-02 | Discharge: 2022-05-02 | Disposition: A | Discharge status: Home / Self Care | Payer: Medicaid Other | Source: Ambulatory Visit | Attending: Family Medicine | Admitting: Family Medicine

## 2022-05-02 ENCOUNTER — Other Ambulatory Visit: Payer: Self-pay

## 2022-05-02 ENCOUNTER — Ambulatory Visit (INDEPENDENT_AMBULATORY_CARE_PROVIDER_SITE_OTHER): Payer: Medicaid Other | Admitting: Family Medicine

## 2022-05-02 ENCOUNTER — Encounter: Payer: Self-pay | Admitting: Family Medicine

## 2022-05-02 VITALS — BP 107/74 | HR 78 | Wt 143.0 lb

## 2022-05-02 DIAGNOSIS — Z3042 Encounter for surveillance of injectable contraceptive: Secondary | ICD-10-CM | POA: Diagnosis not present

## 2022-05-02 DIAGNOSIS — Z0000 Encounter for general adult medical examination without abnormal findings: Secondary | ICD-10-CM

## 2022-05-02 DIAGNOSIS — R87610 Atypical squamous cells of undetermined significance on cytologic smear of cervix (ASC-US): Secondary | ICD-10-CM

## 2022-05-02 DIAGNOSIS — Z0001 Encounter for general adult medical examination with abnormal findings: Secondary | ICD-10-CM

## 2022-05-02 DIAGNOSIS — R87810 Cervical high risk human papillomavirus (HPV) DNA test positive: Secondary | ICD-10-CM

## 2022-05-02 MED ORDER — MEDROXYPROGESTERONE ACETATE 150 MG/ML IM SUSP
150.0000 mg | Freq: Once | INTRAMUSCULAR | Status: AC
  Administered 2022-05-02: 150 mg via INTRAMUSCULAR

## 2022-05-02 NOTE — Addendum Note (Signed)
Addended byVidal Schwalbe on: 05/02/2022 11:16 AM   Modules accepted: Orders

## 2022-05-02 NOTE — Addendum Note (Signed)
Addended byVidal Schwalbe on: 05/02/2022 11:10 AM   Modules accepted: Orders

## 2022-05-02 NOTE — Progress Notes (Signed)
ANNUAL EXAM Patient name: Heather Whitaker MRN [id number]  Date of birth: 1996/05/23 Chief Complaint:   Gynecologic Exam  History of Present Illness:   Heather Whitaker is a 26 y.o. G16P1001 female being seen today for a routine annual exam, follow up from abnormal PAP,  and depo shot Current complaints: None  LMP: 6/17 (currently on period)   The pregnancy intention screening data noted above was reviewed. Potential methods of contraception were discussed. The patient elected to proceed with Depo   Last pap 02/2021. Results were: ASCUS w/ HRHPV positive: other (not 16, 18/45). Had colposcopy with CIN1 H/O abnormal pap: yes Last mammogram: none. Results were: N/A. Family h/o breast cancer: no Last colonoscopy: none. Results were: N/A. Family h/o colorectal cancer: no     11/27/2021    9:06 AM 03/28/2021    4:34 PM 09/26/2020    4:00 PM 06/20/2020   10:31 AM 03/06/2018    2:47 PM  Depression screen PHQ 2/9  Decreased Interest 0 0 0 0 0  Down, Depressed, Hopeless 0 0 0 0 0  PHQ - 2 Score 0 0 0 0 0  Altered sleeping 0 0 0 0 0  Tired, decreased energy 0 0 0 0 0  Change in appetite 0 0 0 0 0  Feeling bad or failure about yourself  0 0 0 0 0  Trouble concentrating 0 0 0 0 0  Moving slowly or fidgety/restless 0 0 0 0 0  Suicidal thoughts 0 0 0 0 0  PHQ-9 Score 0 0 0 0 0        11/27/2021    9:06 AM 03/28/2021    4:34 PM 09/26/2020    4:00 PM 06/20/2020   10:31 AM  GAD 7 : Generalized Anxiety Score  Nervous, Anxious, on Edge 0 0 0 0  Control/stop worrying 0 0 0 0  Worry too much - different things 0 0 0 1  Trouble relaxing 0 0 0 0  Restless 0 0 0 0  Easily annoyed or irritable 0 0 0 0  Afraid - awful might happen 0 0 0 0  Total GAD 7 Score 0 0 0 1     Review of Systems:   Pertinent items are noted in HPI Denies any headaches, blurred vision, fatigue, shortness of breath, chest pain, abdominal pain, abnormal vaginal discharge/itching/odor/irritation, problems with periods, bowel  movements, urination, or intercourse unless otherwise stated above. Pertinent History Reviewed:  Reviewed past medical,surgical, social and family history.  Reviewed problem list, medications and allergies. Physical Assessment:   Vitals:   05/02/22 1043  BP: 107/74  Pulse: 78  Weight: 143 lb (64.9 kg)  Body mass index is 23.8 kg/m.        Physical Examination:   General appearance - well appearing, and in no distress  Mental status - alert, oriented to person, place, and time  Chest - effort normal, all lung fields clear to auscultation bilaterally  Heart - normal rate  Breasts - deferred  Abdomen - soft, nontender, nondistended, no masses or organomegaly  Pelvic - VULVA: normal appearing vulva with no masses, tenderness or lesions  VAGINA: normal appearing vagina with normal color and discharge, no lesions. CERVIX: normal appearing cervix without discharge or lesions, no CMT  Thin prep pap is done  HR HPV cotesting   Chaperone present for exam  No results found for this or any previous visit (from the past 24 hour(s)).  Assessment & Plan:  1) Well-Woman Exam Doing  well. No acute concerns. Exam within normal limits.   2) Cervical cancer screening PAP hx: ASCUS, HPV+ in 02/2021, Colpo CIN 1. Due for repeat PAP today. Done today  Labs/procedures today: PAP, Depo  3) Contraception management Here for depo today.   Orders Placed This Encounter  Procedures   HIV antibody (with reflex)   RPR   Hepatitis C RNA quantitative   Hepatitis B Surface AntiGEN    Meds: No orders of the defined types were placed in this encounter.   Follow-up: Return in about 3 months (around 08/02/2022) for RN visit for Depo.  Warner Mccreedy, MD 05/02/2022 11:08 AM

## 2022-05-03 LAB — HEPATITIS B SURFACE ANTIGEN: Hepatitis B Surface Ag: NEGATIVE

## 2022-05-03 LAB — HIV ANTIBODY (ROUTINE TESTING W REFLEX): HIV Screen 4th Generation wRfx: NONREACTIVE

## 2022-05-03 LAB — HEPATITIS C ANTIBODY: Hep C Virus Ab: NONREACTIVE

## 2022-05-03 LAB — RPR: RPR Ser Ql: NONREACTIVE

## 2022-05-06 LAB — CYTOLOGY - PAP
Chlamydia: NEGATIVE
Comment: NEGATIVE
Comment: NEGATIVE
Comment: NEGATIVE
Comment: NORMAL
Diagnosis: NEGATIVE
Diagnosis: REACTIVE
High risk HPV: NEGATIVE
Neisseria Gonorrhea: NEGATIVE
Trichomonas: NEGATIVE

## 2022-08-02 ENCOUNTER — Ambulatory Visit: Payer: Medicaid Other

## 2022-08-02 ENCOUNTER — Other Ambulatory Visit: Payer: Self-pay

## 2022-08-08 ENCOUNTER — Ambulatory Visit (INDEPENDENT_AMBULATORY_CARE_PROVIDER_SITE_OTHER): Payer: Medicaid Other | Admitting: General Practice

## 2022-08-08 ENCOUNTER — Other Ambulatory Visit: Payer: Self-pay

## 2022-08-08 VITALS — BP 106/66 | HR 85 | Ht 65.0 in | Wt 143.0 lb

## 2022-08-08 DIAGNOSIS — Z3042 Encounter for surveillance of injectable contraceptive: Secondary | ICD-10-CM | POA: Diagnosis not present

## 2022-08-08 MED ORDER — MEDROXYPROGESTERONE ACETATE 150 MG/ML IM SUSP
150.0000 mg | Freq: Once | INTRAMUSCULAR | Status: AC
  Administered 2022-08-08: 150 mg via INTRAMUSCULAR

## 2022-08-08 NOTE — Progress Notes (Addendum)
Heather Whitaker here for Depo-Provera Injection. Injection administered without complication. Patient will return in 3 months for next injection between 12/14 and 12/28. Next annual visit due June 2024.   Derinda Late, RN 08/08/2022  8:28 AM

## 2022-10-24 ENCOUNTER — Ambulatory Visit (INDEPENDENT_AMBULATORY_CARE_PROVIDER_SITE_OTHER): Payer: Medicaid Other

## 2022-10-24 ENCOUNTER — Other Ambulatory Visit: Payer: Self-pay

## 2022-10-24 VITALS — BP 118/72 | HR 102 | Ht 65.5 in | Wt 148.8 lb

## 2022-10-24 DIAGNOSIS — Z3042 Encounter for surveillance of injectable contraceptive: Secondary | ICD-10-CM | POA: Diagnosis not present

## 2022-10-24 MED ORDER — MEDROXYPROGESTERONE ACETATE 150 MG/ML IM SUSP
150.0000 mg | Freq: Once | INTRAMUSCULAR | Status: AC
  Administered 2022-10-24: 150 mg via INTRAMUSCULAR

## 2022-10-24 NOTE — Progress Notes (Signed)
Heather Whitaker here for Depo-Provera Injection. Injection administered without complication. Patient will return in 3 months for next injection between March 1 and March . Next annual visit due 05/2023.   Ralene Bathe, RN 10/24/2022  9:18 AM

## 2023-01-20 ENCOUNTER — Ambulatory Visit: Payer: Medicaid Other

## 2023-01-23 ENCOUNTER — Other Ambulatory Visit: Payer: Self-pay

## 2023-01-23 ENCOUNTER — Ambulatory Visit (INDEPENDENT_AMBULATORY_CARE_PROVIDER_SITE_OTHER): Payer: Medicaid Other | Admitting: *Deleted

## 2023-01-23 VITALS — BP 110/67 | HR 80 | Ht 65.5 in | Wt 149.1 lb

## 2023-01-23 DIAGNOSIS — Z3042 Encounter for surveillance of injectable contraceptive: Secondary | ICD-10-CM | POA: Diagnosis not present

## 2023-01-23 MED ORDER — MEDROXYPROGESTERONE ACETATE 150 MG/ML IM SUSP
150.0000 mg | Freq: Once | INTRAMUSCULAR | Status: AC
  Administered 2023-01-23: 150 mg via INTRAMUSCULAR

## 2023-01-23 NOTE — Progress Notes (Signed)
Depo Provera 150 mg IM administered as scheduled. Pt tolerated well. Next injection due 5/30-6/13.  Next annual Gyn exam due after 05/03/23.

## 2023-04-10 ENCOUNTER — Ambulatory Visit (INDEPENDENT_AMBULATORY_CARE_PROVIDER_SITE_OTHER): Payer: Medicaid Other | Admitting: *Deleted

## 2023-04-10 ENCOUNTER — Other Ambulatory Visit: Payer: Self-pay

## 2023-04-10 VITALS — BP 111/84 | HR 86 | Ht 65.5 in | Wt 147.8 lb

## 2023-04-10 DIAGNOSIS — Z3042 Encounter for surveillance of injectable contraceptive: Secondary | ICD-10-CM | POA: Diagnosis not present

## 2023-04-10 MED ORDER — MEDROXYPROGESTERONE ACETATE 150 MG/ML IM SUSY
150.0000 mg | PREFILLED_SYRINGE | Freq: Once | INTRAMUSCULAR | Status: AC
  Administered 2023-04-10: 150 mg via INTRAMUSCULAR

## 2023-04-10 NOTE — Progress Notes (Signed)
Heather Whitaker here for Depo-Provera  Injection. Last annual exam and pap smear 05/03/22 . Last injection 01/23/23. Injection administered without complication. Patient will return in 3 months for next injection. Advised to schedule annual exam before or with next injection. Patient voices understanding. [name] [facility] 04/10/2023  10:58 AM

## 2023-05-28 ENCOUNTER — Ambulatory Visit: Payer: Medicaid Other | Admitting: Certified Nurse Midwife

## 2023-06-15 ENCOUNTER — Ambulatory Visit
Admission: EM | Admit: 2023-06-15 | Discharge: 2023-06-15 | Disposition: A | Discharge status: Home / Self Care | Payer: Medicaid Other | Attending: Physician Assistant | Admitting: Physician Assistant

## 2023-06-15 ENCOUNTER — Telehealth: Payer: Self-pay | Admitting: Emergency Medicine

## 2023-06-15 DIAGNOSIS — K047 Periapical abscess without sinus: Secondary | ICD-10-CM

## 2023-06-15 MED ORDER — AMOXICILLIN 500 MG PO CAPS: 500.0000 mg | ORAL_CAPSULE | Freq: Three times a day (TID) | ORAL | 0 refills | Status: DC

## 2023-06-15 NOTE — ED Triage Notes (Signed)
"  I am having left lower dental pain and swelling moving to left ear". Started "Monday/Tuesday".

## 2023-06-15 NOTE — Discharge Instructions (Signed)
  Please follow up with dentist as soon as possible.  

## 2023-06-15 NOTE — ED Provider Notes (Signed)
[facility]    CSN: [id number] Arrival date & time: 06/15/23  1346      History   Chief Complaint Chief Complaint  Patient presents with   Dental Pain    HPI Heather Whitaker is a 27 y.o. female.   Patient here today for evaluation of left lower molar pain that started at the beginning of the week.  She reports that she has continued to have worsening pain and swelling.  She has not had fever.  She denies any vomiting.  She has tried Tylenol without resolution.  The history is provided by the patient.  Dental Pain Associated symptoms: no fever     Past Medical History:  Diagnosis Date   Back pain affecting pregnancy in third trimester 03/26/2017   Irregular uterine contractions 03/26/2017   Medical history non-contributory     Patient Active Problem List   Diagnosis Date Noted   Surveillance for Depo-Provera contraception 08/31/2019    Past Surgical History:  Procedure Laterality Date   MYRINGOTOMY WITH TUBE PLACEMENT      OB History     Gravida  1   Para  1   Term  1   Preterm  0   AB  0   Living  1      SAB  0   IAB  0   Ectopic  0   Multiple  0   Live Births  1            Home Medications    Prior to Admission medications   Medication Sig Start Date End Date Taking? Authorizing Provider  amoxicillin (AMOXIL) 500 MG capsule Take 1 capsule (500 mg total) by mouth 3 (three) times daily. 06/15/23  Yes Tomi Bamberger, PA-C  medroxyPROGESTERone (DEPO-PROVERA) 150 MG/ML injection Inject 150 mg into the muscle every 3 (three) months.   Yes [provider]    Family History Family History  Problem Relation Age of Onset   Hypertension Maternal Grandfather    Asthma Brother    Cancer Neg Hx    Diabetes Neg Hx     Social History Social History   Tobacco Use   Smoking status: Never   Smokeless tobacco: Never  Vaping Use   Vaping status: Never Used  Substance Use Topics   Alcohol use: Yes   Drug use: No      Allergies   Patient has no known allergies.   Review of Systems Review of Systems  Constitutional:  Negative for chills and fever.  HENT:  Positive for dental problem.   Eyes:  Negative for discharge and redness.  Gastrointestinal:  Negative for abdominal pain, nausea and vomiting.     Physical Exam Triage Vital Signs ED Triage Vitals  Encounter Vitals Group     BP 06/15/23 1419 114/79     Systolic BP Percentile --      Diastolic BP Percentile --      Pulse Rate 06/15/23 1419 82     Resp 06/15/23 1419 18     Temp 06/15/23 1419 99 F (37.2 C)     Temp Source 06/15/23 1419 Oral     SpO2 06/15/23 1419 99 %     Weight 06/15/23 1418 150 lb (68 kg)     Height 06/15/23 1418 5' 5.5" (1.664 m)     Head Circumference --      Peak Flow --      Pain Score 06/15/23 1418 10     Pain  Loc --      Pain Education --      Exclude from Growth Chart --    No data found.  Updated Vital Signs BP 114/79 (BP Location: Left Arm)   Pulse 82   Temp 99 F (37.2 C) (Oral)   Resp 18   Ht 5' 5.5" (1.664 m)   Wt 150 lb (68 kg)   LMP  (LMP Unknown)   SpO2 99%   BMI 24.58 kg/m   Physical Exam Vitals and nursing note reviewed.  Constitutional:      General: She is not in acute distress.    Appearance: Normal appearance. She is not ill-appearing.  HENT:     Head: Normocephalic and atraumatic.     Mouth/Throat:     Comments: Diffuse gingival swelling to posterior lower left molars with swelling noted to left mandible area Eyes:     Conjunctiva/sclera: Conjunctivae normal.  Cardiovascular:     Rate and Rhythm: Normal rate.  Pulmonary:     Effort: Pulmonary effort is normal.  Neurological:     Mental Status: She is alert.  Psychiatric:        Mood and Affect: Mood normal.        Behavior: Behavior normal.        Thought Content: Thought content normal.      UC Treatments / Results  Labs (all labs ordered are listed, but only abnormal results are displayed) Labs Reviewed  - No data to display  EKG   Radiology No results found.  Procedures Procedures (including critical care time)  Medications Ordered in UC Medications - No data to display  Initial Impression / Assessment and Plan / UC Course  I have reviewed the triage vital signs and the nursing notes.  Pertinent labs & imaging results that were available during my care of the patient were reviewed by me and considered in my medical decision making (see chart for details).    Recommended ibuprofen and will treat with amoxicillin for treatment of suspected dental abscess.  Recommended follow-up with dentist as soon as possible.  Advised further evaluation in the emergency room with any worsening symptoms or failure to improve.  Final Clinical Impressions(s) / UC Diagnoses   Final diagnoses:  Dental abscess     Discharge Instructions      P lease follow up with dentist as soon as possible.      ED Prescriptions     Medication Sig Dispense Auth. Provider   amoxicillin (AMOXIL) 500 MG capsule Take 1 capsule (500 mg total) by mouth 3 (three) times daily. 21 capsule Tomi Bamberger, PA-C      PDMP not reviewed this encounter.   Tomi Bamberger, PA-C 06/15/23 1459

## 2023-06-19 ENCOUNTER — Ambulatory Visit: Payer: Medicaid Other | Admitting: Obstetrics & Gynecology

## 2023-06-26 ENCOUNTER — Ambulatory Visit: Payer: Medicaid Other

## 2023-07-07 ENCOUNTER — Ambulatory Visit (INDEPENDENT_AMBULATORY_CARE_PROVIDER_SITE_OTHER): Payer: Medicaid Other | Admitting: Advanced Practice Midwife

## 2023-07-07 ENCOUNTER — Encounter: Payer: Self-pay | Admitting: Advanced Practice Midwife

## 2023-07-07 ENCOUNTER — Other Ambulatory Visit: Payer: Self-pay

## 2023-07-07 VITALS — BP 103/73 | HR 99 | Ht 65.5 in | Wt 145.5 lb

## 2023-07-07 DIAGNOSIS — Z01419 Encounter for gynecological examination (general) (routine) without abnormal findings: Secondary | ICD-10-CM

## 2023-07-07 DIAGNOSIS — Z3042 Encounter for surveillance of injectable contraceptive: Secondary | ICD-10-CM

## 2023-07-07 MED ORDER — MEDROXYPROGESTERONE ACETATE 150 MG/ML IM SUSY
150.0000 mg | PREFILLED_SYRINGE | Freq: Once | INTRAMUSCULAR | Status: AC
  Administered 2023-07-07: 150 mg via INTRAMUSCULAR

## 2023-07-07 NOTE — Progress Notes (Signed)
   Subjective:     Heather Whitaker is a 27 y.o. female here at [facility] for a routine exam.  Current complaints: none.  Personal health history reviewed: yes.  Do you have a primary care provider? yes Do you feel safe at home? yes  Flowsheet Row Office Visit from 07/07/2023 in [facility] [employer] at [facility]  PHQ-2 Total Score 0       Health Maintenance Due  Topic Date Due   COVID-19 Vaccine (1 - 2023-24 season) Never done   INFLUENZA VACCINE  06/12/2023     Risk factors for chronic health problems: Smoking: Alchohol/how much: Pt BMI: Body mass index is 23.84 kg/m.   Gynecologic History No LMP recorded (lmp unknown). Patient has had an injection. Contraception: Depo-Provera injections Last Pap: 2023. Results were: normal Last mammogram: n/a.   Obstetric History OB History  Gravida Para Term Preterm AB Living  1 1 1  0 0 1  SAB IAB Ectopic Multiple Live Births  0 0 0 0 1    # Outcome Date GA Lbr Len/2nd Weight Sex Type Anes PTL Lv  1 Term 05/15/17 [redacted]w[redacted]d 01:41 / 00:33 7 lb 1.8 oz (3.226 kg) M Vag-Spont None  LIV     The following portions of the patient's history were reviewed and updated as appropriate: allergies, current medications, past family history, past medical history, past social history, past surgical history, and problem list.  Review of Systems Pertinent items noted in HPI and remainder of comprehensive ROS otherwise negative.    Objective:  BP 103/73   Pulse 99   Ht 5' 5.5" (1.664 m)   Wt 145 lb 8 oz (66 kg)   LMP  (LMP Unknown) Comment: no periods since starting injections  BMI 23.84 kg/m   VS reviewed, nursing note reviewed,  Constitutional: well developed, well nourished, no distress HEENT: normocephalic, thyroid without enlargement or mass HEART: RRR, no murmurs rubs/gallops RESP: clear and equal to auscultation bilaterally in all lobes  Breast Exam:  Deferred with low risks and shared decision making, discussed  recommendation to start mammogram at age 34.   Abdomen: soft Neuro: alert and oriented x 3 Skin: warm, dry Psych: affect normal Pelvic exam: Deferred     Assessment/Plan:   1. Surveillance for Depo-Provera contraception  - medroxyPROGESTERone Acetate SUSY 150 mg  2. Encounter for annual routine gynecological examination   --No gyn concerns or problems --amenorrheic with Depo --Pap up to date   Return in about 1 year (around 07/06/2024) for annual exam.   Sharen Counter, CNM 5:55 PM

## 2023-07-24 ENCOUNTER — Ambulatory Visit
Admission: EM | Admit: 2023-07-24 | Discharge: 2023-07-24 | Disposition: A | Discharge status: Home / Self Care | Payer: Medicaid Other | Attending: Internal Medicine | Admitting: Internal Medicine

## 2023-07-24 VITALS — BP 105/72 | HR 78 | Temp 98.3°F | Resp 19 | Ht 65.5 in | Wt 145.0 lb

## 2023-07-24 DIAGNOSIS — K047 Periapical abscess without sinus: Secondary | ICD-10-CM

## 2023-07-24 DIAGNOSIS — K0889 Other specified disorders of teeth and supporting structures: Secondary | ICD-10-CM

## 2023-07-24 MED ORDER — AMOXICILLIN-POT CLAVULANATE 875-125 MG PO TABS: 1.0000 | ORAL_TABLET | Freq: Two times a day (BID) | ORAL | 0 refills | Status: DC

## 2023-07-24 NOTE — Discharge Instructions (Signed)
I have prescribed an antibiotic today for dental infection.  Take with food.  Follow-up with dentist.

## 2023-07-24 NOTE — ED Triage Notes (Signed)
Pt states that she has some left sided facial swelling. X1 week  Pt states that the swelling is due to dental pain.

## 2023-07-24 NOTE — ED Provider Notes (Signed)
[facility]    CSN: [id number] Arrival date & time: 07/24/23  1000      History   Chief Complaint Chief Complaint  Patient presents with   Facial Swelling    Left side facial swelling. X1 week    HPI Heather Whitaker is a 27 y.o. female.   Patient presents with left lower dental pain and swelling that started about a week ago.  Patient had previous infection in the same location approximately 1 month ago which resolved with antibiotic therapy.  She has not yet followed up with dentist.  Denies any fever.     Past Medical History:  Diagnosis Date   Back pain affecting pregnancy in third trimester 03/26/2017   Irregular uterine contractions 03/26/2017   Medical history non-contributory     Patient Active Problem List   Diagnosis Date Noted   Surveillance for Depo-Provera contraception 08/31/2019    Past Surgical History:  Procedure Laterality Date   MYRINGOTOMY WITH TUBE PLACEMENT      OB History     Gravida  1   Para  1   Term  1   Preterm  0   AB  0   Living  1      SAB  0   IAB  0   Ectopic  0   Multiple  0   Live Births  1            Home Medications    Prior to Admission medications   Medication Sig Start Date End Date Taking? Authorizing Provider  amoxicillin-clavulanate (AUGMENTIN) 875-125 MG tablet Take 1 tablet by mouth every 12 (twelve) hours. 07/24/23  Yes Bertel Venard, Rolly Salter E, FNP  medroxyPROGESTERone (DEPO-PROVERA) 150 MG/ML injection Inject 150 mg into the muscle every 3 (three) months.   Yes [provider]    Family History Family History  Problem Relation Age of Onset   Hypertension Maternal Grandfather    Asthma Brother    Cancer Neg Hx    Diabetes Neg Hx     Social History Social History   Tobacco Use   Smoking status: Never   Smokeless tobacco: Never  Vaping Use   Vaping status: Never Used  Substance Use Topics   Alcohol use: Yes    Comment: occ   Drug use: No     Allergies   Patient  has no known allergies.   Review of Systems Review of Systems Per HPI  Physical Exam Triage Vital Signs ED Triage Vitals  Encounter Vitals Group     BP 07/24/23 1103 105/72     Systolic BP Percentile --      Diastolic BP Percentile --      Pulse Rate 07/24/23 1103 78     Resp 07/24/23 1103 19     Temp 07/24/23 1103 98.3 F (36.8 C)     Temp Source 07/24/23 1103 Oral     SpO2 07/24/23 1103 97 %     Weight 07/24/23 1102 145 lb (65.8 kg)     Height 07/24/23 1102 5' 5.5" (1.664 m)     Head Circumference --      Peak Flow --      Pain Score 07/24/23 1102 6     Pain Loc --      Pain Education --      Exclude from Growth Chart --    No data found.  Updated Vital Signs BP 105/72 (BP Location: Left Arm)   Pulse 78  Temp 98.3 F (36.8 C) (Oral)   Resp 19   Ht 5' 5.5" (1.664 m)   Wt 145 lb (65.8 kg)   LMP  (LMP Unknown) Comment: no periods since starting injections  SpO2 97%   BMI 23.76 kg/m   Visual Acuity Right Eye Distance:   Left Eye Distance:   Bilateral Distance:    Right Eye Near:   Left Eye Near:    Bilateral Near:     Physical Exam Constitutional:      General: She is not in acute distress.    Appearance: Normal appearance. She is not toxic-appearing or diaphoretic.  HENT:     Head: Normocephalic and atraumatic.     Mouth/Throat:      Comments: Patient has swelling and erythema to gingiva surrounding left lower dentition.  No visible abscess. Eyes:     Extraocular Movements: Extraocular movements intact.     Conjunctiva/sclera: Conjunctivae normal.  Pulmonary:     Effort: Pulmonary effort is normal.  Neurological:     General: No focal deficit present.     Mental Status: She is alert and oriented to person, place, and time. Mental status is at baseline.  Psychiatric:        Mood and Affect: Mood normal.        Behavior: Behavior normal.        Thought Content: Thought content normal.        Judgment: Judgment normal.      UC Treatments /  Results  Labs (all labs ordered are listed, but only abnormal results are displayed) Labs Reviewed - No data to display  EKG   Radiology No results found.  Procedures Procedures (including critical care time)  Medications Ordered in UC Medications - No data to display  Initial Impression / Assessment and Plan / UC Course  I have reviewed the triage vital signs and the nursing notes.  Pertinent labs & imaging results that were available during my care of the patient were reviewed by me and considered in my medical decision making (see chart for details).     Physical exam is consistent with dental infection.  Will treat with Augmentin.  Patient advised of importance of following up with dentist as well.  Patient provided with dental resources.  Advised strict follow-up precautions.  Patient verbalized understanding and was agreeable with plan. Final Clinical Impressions(s) / UC Diagnoses   Final diagnoses:  Dental infection  Pain, dental     Discharge Instructions      I have prescribed an antibiotic today for dental infection.  Take with food.  Follow-up with dentist.    ED Prescriptions     Medication Sig Dispense Auth. Provider   amoxicillin-clavulanate (AUGMENTIN) 875-125 MG tablet Take 1 tablet by mouth every 12 (twelve) hours. 14 tablet Vickery, Acie Fredrickson, [place]      PDMP not reviewed this encounter.   Gustavus Bryant, [place] 07/24/23 1134

## 2023-09-22 ENCOUNTER — Ambulatory Visit (INDEPENDENT_AMBULATORY_CARE_PROVIDER_SITE_OTHER): Payer: Medicaid Other | Admitting: General Practice

## 2023-09-22 ENCOUNTER — Encounter: Payer: Self-pay | Admitting: General Practice

## 2023-09-22 ENCOUNTER — Other Ambulatory Visit: Payer: Self-pay

## 2023-09-22 VITALS — BP 103/70 | HR 78 | Ht 65.5 in | Wt 147.0 lb

## 2023-09-22 DIAGNOSIS — Z3042 Encounter for surveillance of injectable contraceptive: Secondary | ICD-10-CM | POA: Diagnosis not present

## 2023-09-22 MED ORDER — MEDROXYPROGESTERONE ACETATE 150 MG/ML IM SUSY
150.0000 mg | PREFILLED_SYRINGE | Freq: Once | INTRAMUSCULAR | Status: AC
Start: 2023-09-22 — End: 2023-09-22
  Administered 2023-09-22: 150 mg via INTRAMUSCULAR

## 2023-09-22 NOTE — Progress Notes (Signed)
Heather Whitaker here for Depo-Provera Injection. Injection administered without complication. Patient will return in 3 months for next injection between Jan 27 and Feb 10. Next annual visit due August 2025.   Marylynn Pearson, RN 09/22/2023  10:31 AM

## 2023-12-08 ENCOUNTER — Other Ambulatory Visit (HOSPITAL_COMMUNITY)
Admission: RE | Admit: 2023-12-08 | Discharge: 2023-12-08 | Disposition: A | Discharge status: Home / Self Care | Payer: Medicaid Other | Source: Ambulatory Visit | Attending: Family Medicine | Admitting: Family Medicine

## 2023-12-08 ENCOUNTER — Ambulatory Visit (INDEPENDENT_AMBULATORY_CARE_PROVIDER_SITE_OTHER): Payer: Medicaid Other

## 2023-12-08 ENCOUNTER — Other Ambulatory Visit: Payer: Self-pay

## 2023-12-08 VITALS — BP 115/64 | HR 76 | Ht 65.0 in | Wt 149.0 lb

## 2023-12-08 DIAGNOSIS — N898 Other specified noninflammatory disorders of vagina: Secondary | ICD-10-CM | POA: Diagnosis not present

## 2023-12-08 DIAGNOSIS — Z3042 Encounter for surveillance of injectable contraceptive: Secondary | ICD-10-CM

## 2023-12-08 MED ORDER — MEDROXYPROGESTERONE ACETATE 150 MG/ML IM SUSY
150.0000 mg | PREFILLED_SYRINGE | Freq: Once | INTRAMUSCULAR | Status: AC
Start: 2023-12-08 — End: 2023-12-08
  Administered 2023-12-08: 150 mg via INTRAMUSCULAR

## 2023-12-08 NOTE — Progress Notes (Signed)
Alexyia Verde here for Depo-Provera Injection. Injection administered without complication. Patient will return in 3 months for next injection between 4/14 and 4/28. Next annual visit due 06/2024.   Patient is also here with concern of vaginal odor. These symptoms have been present for a few days. Patient request to do a vaginal swab with STD testing. Self swab instructions given and specimen obtained. Explained patient will be contacted with any abnormal results. Patient is due for an annual on 06/2024.   Quintella Reichert, RN 12/08/2023  10:24 AM

## 2023-12-10 LAB — CERVICOVAGINAL ANCILLARY ONLY
Bacterial Vaginitis (gardnerella): POSITIVE — AB
Candida Glabrata: NEGATIVE
Candida Vaginitis: NEGATIVE
Chlamydia: NEGATIVE
Comment: NEGATIVE
Comment: NEGATIVE
Comment: NEGATIVE
Comment: NEGATIVE
Comment: NEGATIVE
Comment: NORMAL
Neisseria Gonorrhea: NEGATIVE
Trichomonas: NEGATIVE

## 2023-12-15 ENCOUNTER — Telehealth: Payer: Self-pay

## 2023-12-15 ENCOUNTER — Telehealth: Payer: Self-pay | Admitting: Family Medicine

## 2023-12-15 ENCOUNTER — Other Ambulatory Visit: Payer: Self-pay

## 2023-12-15 DIAGNOSIS — B9689 Other specified bacterial agents as the cause of diseases classified elsewhere: Secondary | ICD-10-CM

## 2023-12-15 DIAGNOSIS — N898 Other specified noninflammatory disorders of vagina: Secondary | ICD-10-CM

## 2023-12-15 MED ORDER — METRONIDAZOLE 500 MG PO TABS
500.0000 mg | ORAL_TABLET | Freq: Two times a day (BID) | ORAL | 0 refills | Status: DC
Start: 2023-12-15 — End: 2024-02-24

## 2023-12-15 NOTE — Telephone Encounter (Signed)
Patient was called an addressed in a telephone encounter.

## 2023-12-15 NOTE — Telephone Encounter (Signed)
Patient calling because she did a self swab on 1/27 and was positive for BV. She has not received a f/u call and would like to receive medication to help treat her. She is requesting the medicine be sent to [facility] on [address]

## 2023-12-15 NOTE — Telephone Encounter (Addendum)
Called patient regarding results on vaginal swab. Informed patient it was positive for BV and antibiotic Flagyl was sent to [facility] on [address]. Advised patient to take antibiotic twice a day for 7 days with food. Patient verbalized understanding and will go pick up medication.   ----- Message from Warden Fillers sent at 12/12/2023  5:50 PM EST ----- BV noted on swab, offer treatment

## 2024-02-24 ENCOUNTER — Other Ambulatory Visit: Payer: Self-pay

## 2024-02-24 ENCOUNTER — Other Ambulatory Visit (HOSPITAL_COMMUNITY)
Admission: RE | Admit: 2024-02-24 | Discharge: 2024-02-24 | Disposition: A | Discharge status: Home / Self Care | Source: Ambulatory Visit | Attending: Family Medicine | Admitting: Family Medicine

## 2024-02-24 ENCOUNTER — Ambulatory Visit: Payer: Medicaid Other

## 2024-02-24 VITALS — BP 104/69 | HR 85 | Ht 65.0 in | Wt 151.0 lb

## 2024-02-24 DIAGNOSIS — Z3042 Encounter for surveillance of injectable contraceptive: Secondary | ICD-10-CM

## 2024-02-24 DIAGNOSIS — Z113 Encounter for screening for infections with a predominantly sexual mode of transmission: Secondary | ICD-10-CM

## 2024-02-24 MED ORDER — MEDROXYPROGESTERONE ACETATE 150 MG/ML IM SUSY
150.0000 mg | PREFILLED_SYRINGE | Freq: Once | INTRAMUSCULAR | Status: AC
Start: 2024-02-24 — End: 2024-02-24
  Administered 2024-02-24: 150 mg via INTRAMUSCULAR

## 2024-02-24 NOTE — Progress Notes (Signed)
 Heather Whitaker here for Depo-Provera Injection. Injection administered without complication. Patient will return in 3 months for next injection between July 1st and July 15th. Next annual visit due 06/2024. Last pap smear was 04/2022.  Additionally, patient request to do a vaginal swab for STI test. Patient reports unprotected intercourse 2 weeks ago and would like to be tested. Reviewed with patient that she will be in contact for any abnormal results and sent treatment if needed. Patient verbalized understanding.   Moira Andrews, RN 02/24/2024  10:56 AM

## 2024-02-25 LAB — CERVICOVAGINAL ANCILLARY ONLY
Bacterial Vaginitis (gardnerella): POSITIVE — AB
Candida Glabrata: NEGATIVE
Candida Vaginitis: POSITIVE — AB
Chlamydia: NEGATIVE
Comment: NEGATIVE
Comment: NEGATIVE
Comment: NEGATIVE
Comment: NEGATIVE
Comment: NEGATIVE
Comment: NORMAL
Neisseria Gonorrhea: NEGATIVE
Trichomonas: NEGATIVE

## 2024-02-26 ENCOUNTER — Encounter: Payer: Self-pay | Admitting: Family Medicine

## 2024-02-26 MED ORDER — FLUCONAZOLE 150 MG PO TABS: 150.0000 mg | ORAL_TABLET | Freq: Every day | ORAL | 2 refills | Status: DC

## 2024-02-26 MED ORDER — METRONIDAZOLE 500 MG PO TABS: 500.0000 mg | ORAL_TABLET | Freq: Two times a day (BID) | ORAL | 0 refills | Status: AC

## 2024-02-26 NOTE — Addendum Note (Signed)
 Addended by: Granville Layer on: 02/26/2024 10:21 AM   Modules accepted: Orders

## 2024-05-27 ENCOUNTER — Ambulatory Visit (INDEPENDENT_AMBULATORY_CARE_PROVIDER_SITE_OTHER)

## 2024-05-27 ENCOUNTER — Other Ambulatory Visit: Payer: Self-pay

## 2024-05-27 ENCOUNTER — Other Ambulatory Visit (HOSPITAL_COMMUNITY)
Admission: RE | Admit: 2024-05-27 | Discharge: 2024-05-27 | Disposition: A | Discharge status: Home / Self Care | Source: Ambulatory Visit | Attending: Family Medicine | Admitting: Family Medicine

## 2024-05-27 VITALS — BP 127/89 | HR 72 | Ht 65.0 in | Wt 155.5 lb

## 2024-05-27 DIAGNOSIS — Z3202 Encounter for pregnancy test, result negative: Secondary | ICD-10-CM | POA: Diagnosis not present

## 2024-05-27 DIAGNOSIS — Z3042 Encounter for surveillance of injectable contraceptive: Secondary | ICD-10-CM | POA: Diagnosis not present

## 2024-05-27 DIAGNOSIS — N898 Other specified noninflammatory disorders of vagina: Secondary | ICD-10-CM

## 2024-05-27 LAB — POCT PREGNANCY, URINE: Preg Test, Ur: NEGATIVE

## 2024-05-27 MED ORDER — MEDROXYPROGESTERONE ACETATE 150 MG/ML IM SUSY
150.0000 mg | PREFILLED_SYRINGE | Freq: Once | INTRAMUSCULAR | Status: AC
Start: 2024-05-27 — End: 2024-05-27
  Administered 2024-05-27: 150 mg via INTRAMUSCULAR

## 2024-05-27 MED ORDER — MEDROXYPROGESTERONE ACETATE 150 MG/ML IM SUSY
150.0000 mg | PREFILLED_SYRINGE | Freq: Once | INTRAMUSCULAR | Status: DC
Start: 2024-05-27 — End: 2024-05-27

## 2024-05-27 NOTE — Progress Notes (Signed)
 Heather Whitaker here for Depo-Provera  Injection. Injection administered without complication. Patient will return in 3 months for next injection between October 2 and October . Next annual visit due 06/2024.   Wyat Infinger, RN 05/27/2024  2:52 PM

## 2024-05-28 LAB — CERVICOVAGINAL ANCILLARY ONLY
Candida Glabrata: NEGATIVE
Candida Vaginitis: NEGATIVE
Chlamydia: NEGATIVE
Comment: NEGATIVE
Comment: NEGATIVE
Comment: NEGATIVE
Comment: NEGATIVE
Comment: NORMAL
Neisseria Gonorrhea: NEGATIVE
Trichomonas: NEGATIVE

## 2024-06-16 ENCOUNTER — Ambulatory Visit (INDEPENDENT_AMBULATORY_CARE_PROVIDER_SITE_OTHER)

## 2024-06-16 ENCOUNTER — Other Ambulatory Visit: Payer: Self-pay

## 2024-06-16 ENCOUNTER — Other Ambulatory Visit (HOSPITAL_COMMUNITY)
Admission: RE | Admit: 2024-06-16 | Discharge: 2024-06-16 | Disposition: A | Discharge status: Home / Self Care | Source: Ambulatory Visit | Attending: Family Medicine | Admitting: Family Medicine

## 2024-06-16 VITALS — BP 107/64 | HR 80 | Ht 65.0 in | Wt 159.0 lb

## 2024-06-16 DIAGNOSIS — N898 Other specified noninflammatory disorders of vagina: Secondary | ICD-10-CM

## 2024-06-16 DIAGNOSIS — N760 Acute vaginitis: Secondary | ICD-10-CM

## 2024-06-16 NOTE — Progress Notes (Signed)
 Heather Whitaker is here with concern of recurrent BV. Patient reports some odor and denies any abnormal vaginal discharge or itchiness. These symptoms have been present for approximately 3 weeks.   Self swab instructions given and specimen obtained. Explained patient will be contacted with any abnormal results. Patient is scheduled for an annual exam on 07/15/24 at 0835 and Depo injection on 08/17/24 at 3:00 PM. Patient confirmed scheduled appointment and denies any other needs at this time.    Rosaline Pendleton, RN 06/16/2024  3:00 PM

## 2024-06-18 LAB — CERVICOVAGINAL ANCILLARY ONLY
Bacterial Vaginitis (gardnerella): POSITIVE — AB
Candida Glabrata: NEGATIVE
Candida Vaginitis: NEGATIVE
Comment: NEGATIVE
Comment: NEGATIVE
Comment: NEGATIVE

## 2024-06-21 ENCOUNTER — Ambulatory Visit: Payer: Self-pay | Admitting: Obstetrics & Gynecology

## 2024-06-21 DIAGNOSIS — B9689 Other specified bacterial agents as the cause of diseases classified elsewhere: Secondary | ICD-10-CM

## 2024-06-21 MED ORDER — METRONIDAZOLE 500 MG PO TABS
500.0000 mg | ORAL_TABLET | Freq: Two times a day (BID) | ORAL | 0 refills | Status: AC
Start: 2024-06-21 — End: 2024-06-28

## 2024-07-15 ENCOUNTER — Ambulatory Visit: Admitting: Obstetrics and Gynecology

## 2024-08-12 ENCOUNTER — Ambulatory Visit

## 2024-08-17 ENCOUNTER — Ambulatory Visit (INDEPENDENT_AMBULATORY_CARE_PROVIDER_SITE_OTHER)

## 2024-08-17 ENCOUNTER — Other Ambulatory Visit (HOSPITAL_COMMUNITY)
Admission: RE | Admit: 2024-08-17 | Discharge: 2024-08-17 | Disposition: A | Discharge status: Home / Self Care | Source: Ambulatory Visit | Attending: Family Medicine | Admitting: Family Medicine

## 2024-08-17 ENCOUNTER — Other Ambulatory Visit: Payer: Self-pay

## 2024-08-17 VITALS — BP 109/74 | HR 76 | Wt 154.4 lb

## 2024-08-17 DIAGNOSIS — Z3042 Encounter for surveillance of injectable contraceptive: Secondary | ICD-10-CM | POA: Diagnosis not present

## 2024-08-17 DIAGNOSIS — N898 Other specified noninflammatory disorders of vagina: Secondary | ICD-10-CM | POA: Insufficient documentation

## 2024-08-17 MED ORDER — MEDROXYPROGESTERONE ACETATE 150 MG/ML IM SUSY
150.0000 mg | PREFILLED_SYRINGE | Freq: Once | INTRAMUSCULAR | Status: AC
Start: 2024-08-17 — End: 2024-08-17
  Administered 2024-08-17: 150 mg via INTRAMUSCULAR

## 2024-08-17 NOTE — Progress Notes (Signed)
 Heather Whitaker here for Depo-Provera   Injection.  Injection administered without complication. Patient will return in 3 months for next injection. Heather Whitaker is here with concern of vaginal discharge. These symptoms have been present for 2 weeks. Patient reports she has tried changing her soaps she is using to wash.   Pertinent history: Vaginal discharge for 2 weeks without relief.   Plan of care: Collect Self-Swab.   Self swab instructions given and specimen obtained. Explained patient will be contacted with any abnormal results. Patient is due for annual exam; offered for patient to schedule during checkout.  Cyndee JAYSON Molt, RN 08/17/2024  3:15 PM   Cyndee JAYSON Molt, RN 08/17/2024  1:28 PM

## 2024-08-18 LAB — CERVICOVAGINAL ANCILLARY ONLY
Bacterial Vaginitis (gardnerella): NEGATIVE
Candida Glabrata: NEGATIVE
Candida Vaginitis: NEGATIVE
Chlamydia: NEGATIVE
Comment: NEGATIVE
Comment: NEGATIVE
Comment: NEGATIVE
Comment: NEGATIVE
Comment: NEGATIVE
Comment: NORMAL
Neisseria Gonorrhea: NEGATIVE
Trichomonas: NEGATIVE

## 2024-08-19 ENCOUNTER — Ambulatory Visit: Payer: Self-pay | Admitting: Advanced Practice Midwife

## 2024-10-19 ENCOUNTER — Ambulatory Visit (INDEPENDENT_AMBULATORY_CARE_PROVIDER_SITE_OTHER)

## 2024-10-19 ENCOUNTER — Other Ambulatory Visit (HOSPITAL_COMMUNITY)
Admission: RE | Admit: 2024-10-19 | Discharge: 2024-10-19 | Disposition: A | Discharge status: Home / Self Care | Source: Ambulatory Visit | Attending: Family Medicine | Admitting: Family Medicine

## 2024-10-19 ENCOUNTER — Other Ambulatory Visit: Payer: Self-pay

## 2024-10-19 VITALS — BP 118/75 | HR 89 | Ht 65.0 in | Wt 154.0 lb

## 2024-10-19 DIAGNOSIS — N898 Other specified noninflammatory disorders of vagina: Secondary | ICD-10-CM

## 2024-10-19 NOTE — Progress Notes (Signed)
 Pt here today with c/o vaginal odor when she urinates.  Scant vaginal discharge that is clear.  Pt explained how to obtain self swab and that we will call with abnormal results.   Pt verbalized understanding with no further questions.   Ziyanna Tolin,RN  10/19/24

## 2024-10-20 ENCOUNTER — Ambulatory Visit: Payer: Self-pay | Admitting: Obstetrics and Gynecology

## 2024-10-20 LAB — CERVICOVAGINAL ANCILLARY ONLY
Bacterial Vaginitis (gardnerella): POSITIVE — AB
Candida Glabrata: NEGATIVE
Candida Vaginitis: NEGATIVE
Chlamydia: NEGATIVE
Comment: NEGATIVE
Comment: NEGATIVE
Comment: NEGATIVE
Comment: NEGATIVE
Comment: NEGATIVE
Comment: NORMAL
Neisseria Gonorrhea: NEGATIVE
Trichomonas: NEGATIVE

## 2024-10-20 MED ORDER — METRONIDAZOLE 500 MG PO TABS: 500.0000 mg | ORAL_TABLET | Freq: Two times a day (BID) | ORAL | 0 refills | Status: AC

## 2024-11-08 ENCOUNTER — Ambulatory Visit: Admitting: Family Medicine

## 2024-11-08 ENCOUNTER — Other Ambulatory Visit (HOSPITAL_COMMUNITY)
Admission: RE | Admit: 2024-11-08 | Discharge: 2024-11-08 | Disposition: A | Discharge status: Home / Self Care | Source: Ambulatory Visit | Attending: Family Medicine | Admitting: Family Medicine

## 2024-11-08 ENCOUNTER — Other Ambulatory Visit: Payer: Self-pay

## 2024-11-08 ENCOUNTER — Encounter: Payer: Self-pay | Admitting: Family Medicine

## 2024-11-08 ENCOUNTER — Ambulatory Visit

## 2024-11-08 VITALS — BP 103/71 | HR 73 | Wt 157.0 lb

## 2024-11-08 DIAGNOSIS — Z01419 Encounter for gynecological examination (general) (routine) without abnormal findings: Secondary | ICD-10-CM | POA: Diagnosis not present

## 2024-11-08 DIAGNOSIS — Z3042 Encounter for surveillance of injectable contraceptive: Secondary | ICD-10-CM

## 2024-11-08 DIAGNOSIS — Z124 Encounter for screening for malignant neoplasm of cervix: Secondary | ICD-10-CM

## 2024-11-08 MED ORDER — MEDROXYPROGESTERONE ACETATE 150 MG/ML IM SUSY
150.0000 mg | PREFILLED_SYRINGE | Freq: Once | INTRAMUSCULAR | Status: AC
  Administered 2024-11-08: 150 mg via INTRAMUSCULAR

## 2024-11-08 NOTE — Progress Notes (Signed)
 Subjective:     Heather Whitaker is a 28 y.o. female and is here for a comprehensive physical exam. The patient reports problems - occasional SOB lasts 1 minutes about 3x/year.   The following portions of the patient's history were reviewed and updated as appropriate: allergies, current medications, past family history, past medical history, past social history, past surgical history, and problem list.  Review of Systems Pertinent items noted in HPI and remainder of comprehensive ROS otherwise negative.   Objective:  Chaperone present for exam   BP 103/71   Pulse 73   Wt 157 lb (71.2 kg)   BMI 26.13 kg/m  General appearance: alert, cooperative, and appears stated age Head: Normocephalic, without obvious abnormality, atraumatic Neck: no adenopathy, supple, symmetrical, trachea midline, and thyroid not enlarged, symmetric, no tenderness/mass/nodules Lungs: clear to auscultation bilaterally Heart: regular rate and rhythm, S1, S2 normal, no murmur, click, rub or gallop Abdomen: soft, non-tender; bowel sounds normal; no masses,  no organomegaly Pelvic: cervix normal in appearance, external genitalia normal, no adnexal masses or tenderness, no cervical motion tenderness, uterus normal size, shape, and consistency, and vagina normal without discharge Extremities: extremities normal, atraumatic, no cyanosis or edema Skin: Skin color, texture, turgor normal. No rashes or lesions Lymph nodes: cervical and supraclavicular nodes are normal. Neurologic: Grossly normal       11/08/2024   11:32 AM 07/07/2023    2:50 PM 11/27/2021    9:06 AM 03/28/2021    4:34 PM  GAD 7 : Generalized Anxiety Score  Nervous, Anxious, on Edge 0 0 0 0  Control/stop worrying 0 0 0 0  Worry too much - different things 0 0 0 0  Trouble relaxing 0 0 0 0  Restless 0 0 0 0  Easily annoyed or irritable 0 0 0 0  Afraid - awful might happen 0 0 0 0  Total GAD 7 Score 0 0 0 0    Flowsheet Row Office Visit from 11/08/2024  in [facility] [employer] [facility]  PHQ-9 Total Score 0     Assessment:    Healthy female exam.      Plan:  Screening for malignant neoplasm of cervix  Encounter for gynecological examination without abnormal finding - Declined flu shot, declined breast exam. GAD7 and PHQ 9 reviewed. - Plan: Cytology - PAP( [place])  Surveillance for Depo-Provera  contraception - Plan: medroxyPROGESTERone  Acetate SUSY 150 mg    See After Visit Summary for Counseling Recommendations

## 2024-11-08 NOTE — Patient Instructions (Signed)

## 2024-11-10 ENCOUNTER — Ambulatory Visit: Payer: Self-pay | Admitting: Family Medicine

## 2024-11-10 LAB — CYTOLOGY - PAP: Diagnosis: NEGATIVE

## 2024-11-15 ENCOUNTER — Ambulatory Visit: Admission: EM | Admit: 2024-11-15 | Discharge: 2024-11-15 | Disposition: A | Discharge status: Home / Self Care

## 2024-11-15 ENCOUNTER — Encounter: Payer: Self-pay | Admitting: *Deleted

## 2024-11-15 DIAGNOSIS — N760 Acute vaginitis: Secondary | ICD-10-CM

## 2024-11-15 MED ORDER — TERCONAZOLE 0.8 % VA CREA: 1.0000 | TOPICAL_CREAM | Freq: Every day | VAGINAL | 0 refills | Status: AC

## 2024-11-15 MED ORDER — FLUCONAZOLE 150 MG PO TABS: ORAL_TABLET | ORAL | 1 refills | Status: AC

## 2024-11-15 MED ORDER — METRONIDAZOLE 500 MG PO TABS: 500.0000 mg | ORAL_TABLET | Freq: Two times a day (BID) | ORAL | 0 refills | Status: AC

## 2024-11-15 NOTE — ED Triage Notes (Signed)
 Pt reports severe vaginal itching x 1 week with white discharge. States she did change soaps. No meds tried.

## 2024-11-15 NOTE — ED Provider Notes (Signed)
 " [facility]    CSN: [id number] Arrival date & time: 11/15/24  1328      History   Chief Complaint Chief Complaint  Patient presents with   Vaginal Itching    HPI Heather Whitaker is a 29 y.o. female.   Pt presents today due to 1 week worth of vaginal itching and malodorous, thick, white vaginal discharge after using a new soap. Pt denies concern for STIs.   The history is provided by the patient.  Vaginal Itching    Past Medical History:  Diagnosis Date   Back pain affecting pregnancy in third trimester 03/26/2017   Irregular uterine contractions 03/26/2017   Medical history non-contributory     Patient Active Problem List   Diagnosis Date Noted   Surveillance for Depo-Provera  contraception 08/31/2019    Past Surgical History:  Procedure Laterality Date   MYRINGOTOMY WITH TUBE PLACEMENT      OB History     Gravida  1   Para  1   Term  1   Preterm  0   AB  0   Living  1      SAB  0   IAB  0   Ectopic  0   Multiple  0   Live Births  1            Home Medications    Prior to Admission medications  Medication Sig Start Date End Date Taking? Authorizing Provider  fluconazole  (DIFLUCAN ) 150 MG tablet Take 1 tab po every 3 days 11/15/24  Yes Andra Krabbe C, PA-C  medroxyPROGESTERone  (DEPO-PROVERA ) 150 MG/ML injection Inject 150 mg into the muscle every 3 (three) months.   Yes [provider]  metroNIDAZOLE  (FLAGYL ) 500 MG tablet Take 1 tablet (500 mg total) by mouth 2 (two) times daily. 11/15/24  Yes Andra Krabbe C, PA-C  terconazole  (TERAZOL 3 ) 0.8 % vaginal cream Place 1 applicator vaginally at bedtime. 11/15/24  Yes Andra Krabbe BROCKS, PA-C    Family History Family History  Problem Relation Age of Onset   Asthma Brother    Stroke Maternal Grandfather    Hypertension Maternal Grandfather    Cancer Neg Hx    Diabetes Neg Hx     Social History Social History[1]   Allergies   Patient has no known  allergies.   Review of Systems Review of Systems   Physical Exam Triage Vital Signs ED Triage Vitals  Encounter Vitals Group     BP 11/15/24 1400 116/79     Girls Systolic BP Percentile --      Girls Diastolic BP Percentile --      Boys Systolic BP Percentile --      Boys Diastolic BP Percentile --      Pulse Rate 11/15/24 1400 86     Resp 11/15/24 1400 18     Temp 11/15/24 1400 98 F (36.7 C)     Temp Source 11/15/24 1400 Oral     SpO2 11/15/24 1400 98 %     Weight --      Height --      Head Circumference --      Peak Flow --      Pain Score 11/15/24 1358 8     Pain Loc --      Pain Education --      Exclude from Growth Chart --    No data found.  Updated Vital Signs BP 116/79 (BP Location: Left Arm)   Pulse 86  Temp 98 F (36.7 C) (Oral)   Resp 18   LMP  (LMP Unknown)   SpO2 98%   Visual Acuity Right Eye Distance:   Left Eye Distance:   Bilateral Distance:    Right Eye Near:   Left Eye Near:    Bilateral Near:     Physical Exam Vitals and nursing note reviewed.  Constitutional:      General: She is not in acute distress.    Appearance: Normal appearance. She is not ill-appearing, toxic-appearing or diaphoretic.  Eyes:     General: No scleral icterus. Cardiovascular:     Rate and Rhythm: Normal rate and regular rhythm.     Heart sounds: Normal heart sounds.  Pulmonary:     Effort: Pulmonary effort is normal. No respiratory distress.     Breath sounds: Normal breath sounds. No wheezing or rhonchi.  Abdominal:     General: Abdomen is flat. Bowel sounds are normal.     Palpations: Abdomen is soft.     Tenderness: There is no abdominal tenderness. There is no right CVA tenderness or left CVA tenderness.  Skin:    General: Skin is warm.  Neurological:     Mental Status: She is alert and oriented to person, place, and time.  Psychiatric:        Mood and Affect: Mood normal.        Behavior: Behavior normal.      UC Treatments / Results   Labs (all labs ordered are listed, but only abnormal results are displayed) Labs Reviewed - No data to display  EKG   Radiology No results found.  Procedures Procedures (including critical care time)  Medications Ordered in UC Medications - No data to display  Initial Impression / Assessment and Plan / UC Course  I have reviewed the triage vital signs and the nursing notes.  Pertinent labs & imaging results that were available during my care of the patient were reviewed by me and considered in my medical decision making (see chart for details).     Final Clinical Impressions(s) / UC Diagnoses   Final diagnoses:  Acute vaginitis   Discharge Instructions   None    ED Prescriptions     Medication Sig Dispense Auth. Provider   fluconazole  (DIFLUCAN ) 150 MG tablet Take 1 tab po every 3 days 2 tablet Andra Krabbe C, PA-C   metroNIDAZOLE  (FLAGYL ) 500 MG tablet Take 1 tablet (500 mg total) by mouth 2 (two) times daily. 14 tablet Naya Ilagan C, PA-C   terconazole  (TERAZOL 3 ) 0.8 % vaginal cream Place 1 applicator vaginally at bedtime. 20 g Andra Krabbe BROCKS, PA-C      PDMP not reviewed this encounter.    [1]  Social History Tobacco Use   Smoking status: Never   Smokeless tobacco: Never  Vaping Use   Vaping status: Never Used  Substance Use Topics   Alcohol use: Yes    Comment: occ   Drug use: No     Andra Krabbe BROCKS, PA-C 11/15/24 1437  "

## 2024-11-16 ENCOUNTER — Ambulatory Visit: Payer: Self-pay

## 2024-11-23 ENCOUNTER — Ambulatory Visit: Payer: Self-pay

## 2025-01-24 ENCOUNTER — Ambulatory Visit: Payer: Self-pay
